# Patient Record
Sex: Male | Born: 1975
Health system: Southern US, Community
[De-identification: ages and names within clinical notes are randomized; demographics above are authoritative.]

## PROBLEM LIST (undated history)

## (undated) HISTORY — PX: WISDOM TOOTH EXTRACTION: SHX21

## (undated) HISTORY — PX: OTHER SURGICAL HISTORY: SHX169

---

## 2000-02-18 ENCOUNTER — Encounter: Payer: Self-pay | Admitting: General Surgery

## 2000-02-18 ENCOUNTER — Encounter: Payer: Self-pay | Admitting: Emergency Medicine

## 2000-02-18 ENCOUNTER — Inpatient Hospital Stay (HOSPITAL_COMMUNITY): Admission: RE | Admit: 2000-02-18 | Discharge: 2000-03-07 | Payer: Self-pay

## 2000-02-19 ENCOUNTER — Encounter: Payer: Self-pay | Admitting: General Surgery

## 2000-02-19 ENCOUNTER — Encounter: Payer: Self-pay | Admitting: *Deleted

## 2000-02-19 ENCOUNTER — Encounter: Payer: Self-pay | Admitting: Pulmonary Disease

## 2000-02-20 ENCOUNTER — Encounter: Payer: Self-pay | Admitting: General Surgery

## 2000-02-21 ENCOUNTER — Encounter: Payer: Self-pay | Admitting: General Surgery

## 2000-02-22 ENCOUNTER — Encounter: Payer: Self-pay | Admitting: General Surgery

## 2000-02-24 ENCOUNTER — Encounter: Payer: Self-pay | Admitting: General Surgery

## 2000-02-25 ENCOUNTER — Encounter: Payer: Self-pay | Admitting: Surgery

## 2000-02-26 ENCOUNTER — Encounter: Payer: Self-pay | Admitting: General Surgery

## 2000-02-27 ENCOUNTER — Encounter: Payer: Self-pay | Admitting: Pulmonary Disease

## 2000-02-27 ENCOUNTER — Encounter: Payer: Self-pay | Admitting: General Surgery

## 2000-02-28 ENCOUNTER — Encounter: Payer: Self-pay | Admitting: General Surgery

## 2000-02-29 ENCOUNTER — Encounter: Payer: Self-pay | Admitting: General Surgery

## 2000-03-02 ENCOUNTER — Encounter: Payer: Self-pay | Admitting: General Surgery

## 2000-03-03 ENCOUNTER — Encounter: Payer: Self-pay | Admitting: General Surgery

## 2000-03-04 ENCOUNTER — Encounter: Payer: Self-pay | Admitting: *Deleted

## 2000-03-05 ENCOUNTER — Encounter: Payer: Self-pay | Admitting: General Surgery

## 2000-03-07 ENCOUNTER — Inpatient Hospital Stay (HOSPITAL_COMMUNITY)
Admission: RE | Admit: 2000-03-07 | Discharge: 2000-03-20 | Payer: Self-pay | Admitting: Physical Medicine and Rehabilitation

## 2000-03-07 ENCOUNTER — Encounter: Payer: Self-pay | Admitting: General Surgery

## 2000-03-24 ENCOUNTER — Encounter
Admission: RE | Admit: 2000-03-24 | Discharge: 2000-04-11 | Payer: Self-pay | Admitting: Physical Medicine and Rehabilitation

## 2000-04-04 ENCOUNTER — Ambulatory Visit (HOSPITAL_COMMUNITY): Admission: RE | Admit: 2000-04-04 | Discharge: 2000-04-04 | Payer: Self-pay | Admitting: *Deleted

## 2000-04-04 ENCOUNTER — Encounter: Payer: Self-pay | Admitting: General Surgery

## 2000-04-09 ENCOUNTER — Encounter: Payer: Self-pay | Admitting: General Surgery

## 2000-04-09 ENCOUNTER — Ambulatory Visit (HOSPITAL_COMMUNITY): Admission: RE | Admit: 2000-04-09 | Discharge: 2000-04-09 | Payer: Self-pay | Admitting: General Surgery

## 2000-04-29 ENCOUNTER — Ambulatory Visit (HOSPITAL_COMMUNITY): Admission: RE | Admit: 2000-04-29 | Discharge: 2000-04-29 | Payer: Self-pay | Admitting: *Deleted

## 2000-04-29 ENCOUNTER — Encounter: Payer: Self-pay | Admitting: *Deleted

## 2000-05-06 ENCOUNTER — Encounter
Admission: RE | Admit: 2000-05-06 | Discharge: 2000-08-04 | Payer: Self-pay | Admitting: Physical Medicine and Rehabilitation

## 2000-07-22 ENCOUNTER — Encounter: Payer: Self-pay | Admitting: General Surgery

## 2000-07-22 ENCOUNTER — Ambulatory Visit (HOSPITAL_COMMUNITY): Admission: RE | Admit: 2000-07-22 | Discharge: 2000-07-22 | Payer: Self-pay | Admitting: General Surgery

## 2000-09-04 ENCOUNTER — Emergency Department (HOSPITAL_COMMUNITY): Admission: EM | Admit: 2000-09-04 | Discharge: 2000-09-04 | Payer: Self-pay | Admitting: Emergency Medicine

## 2003-02-08 ENCOUNTER — Emergency Department (HOSPITAL_COMMUNITY): Admission: EM | Admit: 2003-02-08 | Discharge: 2003-02-09 | Payer: Self-pay | Admitting: Emergency Medicine

## 2003-02-09 ENCOUNTER — Encounter: Payer: Self-pay | Admitting: Emergency Medicine

## 2003-04-03 ENCOUNTER — Emergency Department (HOSPITAL_COMMUNITY): Admission: EM | Admit: 2003-04-03 | Discharge: 2003-04-03 | Payer: Self-pay | Admitting: Emergency Medicine

## 2003-05-25 ENCOUNTER — Emergency Department (HOSPITAL_COMMUNITY): Admission: EM | Admit: 2003-05-25 | Discharge: 2003-05-25 | Payer: Self-pay | Admitting: Emergency Medicine

## 2006-06-15 ENCOUNTER — Emergency Department (HOSPITAL_COMMUNITY): Admission: EM | Admit: 2006-06-15 | Discharge: 2006-06-15 | Payer: Self-pay | Admitting: Emergency Medicine

## 2006-10-13 ENCOUNTER — Emergency Department (HOSPITAL_COMMUNITY): Admission: EM | Admit: 2006-10-13 | Discharge: 2006-10-13 | Payer: Self-pay | Admitting: Emergency Medicine

## 2007-08-12 ENCOUNTER — Emergency Department (HOSPITAL_COMMUNITY): Admission: EM | Admit: 2007-08-12 | Discharge: 2007-08-12 | Payer: Self-pay | Admitting: Emergency Medicine

## 2007-08-25 ENCOUNTER — Emergency Department (HOSPITAL_COMMUNITY): Admission: EM | Admit: 2007-08-25 | Discharge: 2007-08-26 | Payer: Self-pay | Admitting: Emergency Medicine

## 2007-12-01 ENCOUNTER — Emergency Department (HOSPITAL_COMMUNITY): Admission: EM | Admit: 2007-12-01 | Discharge: 2007-12-01 | Payer: Self-pay | Admitting: Emergency Medicine

## 2008-07-09 ENCOUNTER — Emergency Department (HOSPITAL_COMMUNITY): Admission: EM | Admit: 2008-07-09 | Discharge: 2008-07-09 | Payer: Self-pay | Admitting: Emergency Medicine

## 2009-02-20 ENCOUNTER — Emergency Department (HOSPITAL_COMMUNITY): Admission: EM | Admit: 2009-02-20 | Discharge: 2009-02-20 | Payer: Self-pay | Admitting: Emergency Medicine

## 2010-03-05 ENCOUNTER — Emergency Department (HOSPITAL_COMMUNITY): Admission: EM | Admit: 2010-03-05 | Discharge: 2010-03-06 | Payer: Self-pay | Admitting: Emergency Medicine

## 2010-10-12 NOTE — Op Note (Signed)
. [facility]  Patient:    Donald Ray, Donald Ray                      MRN: [id number] Proc. Date: 02/29/00 Adm. Date:  [id number] Attending:  Trauma, Md                           Operative Report  PREOPERATIVE DIAGNOSIS:  Severe closed head injury with respiratory failure.  POSTOPERATIVE DIAGNOSIS:  Severe closed head injury with respiratory failure.  OPERATION: 1. Percutaneous tracheostomy tube. 2. Percutaneous endoscopic gastrostomy tube.  SURGEON:  Jimmye Norman, M.D.  ASSISTANT:  Shawn Rayburn, P.A.-C.  ANESTHESIA:  General endotracheal anesthesia.  ESTIMATED BLOOD LOSS:  Less than 20 cc.  COMPLICATIONS:  None.  CONDITION:  Stable.  INDICATIONS:  The patient is a 35 year old male with a severe closed head injury and secondary respiratory failure who now required tracheostomy for long-term ventilatory support and pulmonary toilet and gastrostomy tube for continued nutritional support.  FINDINGS:  The patient had normal anatomy.  He had severe, thick, foul-smelling secretions from his trachea.  DESCRIPTION OF PROCEDURE:  The percutaneous endoscopic gastrostomy tube was done initially, and the left posterior gastroscope was passed through the patients oropharynx into the proximal esophagus, down into the stomach.  The anterior abdominal wall was prepped with Betadine prior to a percutaneous access.  We scoped down through the stomach into and through the pylorus into the proximal first portion of the duodenum.  We had no evidence of active disease.  Subsequently, we came to through the mid portion of the stomach just proximal to the incisor, saw the abdominal palpation where the capsule was passed into the gastric wall.  Once this 16-gauge catheter was passed, we passed a wire through the catheter into the stomach which was grabbed with a snare pass through endoscope.  We brought that out through the mouth and subsequently looped it around the  PEG tube which was brought back through the mouth, through the anterior abdominal wall which was enlarged with a #11 blade.  We brought it out to the bolster, re-endoscoped the patient and found the tube to be bolstered in the proper position.  When this was done, the bolster that came with the package was placed on the anterior abdominal wall, and the endoscope removed.  We placed the appropriate attachments on the gastrostomy tube.  Once the G tube was in place, we again prepped with Betadine at the neck and placed the patient in reverse Trendelenburg position.  A transverse incision was made just above the sternal notch using a #15 blade, and we dissected down to the pretracheal fascia, through the pretracheal fascia, underneath the thyroid gland to the second-third tracheal ring.  With Army-Navy retractor in place, we accessed the trachea using a 16-gauge needle and passing to the trachea with saline in an attached syringe to note entrance into the trachea. We aspirated out some large thickened mucoid drainage.  The capsule was passed into the trachea, and subsequently a wire passed through the catheter into the trachea, and it was noted to be in proper position using the bronchoscope. When this was done, the trachea was dilated to the proper size.  A #8 Shiley was passed over dilator and introducer and into the trachea and secured in place with Velcro tracheostomy tube holders.  We passed the cannula, attached it to the ventilator, showed there to be adequate carmine dye  on return.  The patient oxygenated well, but we did suction out a large amount of foul-smelling sputum. Once the tracheostomy was in place, a dressing was applied.  The patient was taken back to neurology ICU in stable condition. DD:  02/29/00 TD:  03/03/00 Job: [id number]
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
## 2010-10-12 NOTE — Op Note (Signed)
. [facility]  Patient:    Donald Ray, Donald Ray                      MRN: [id number] Proc. Date: 02/19/00 Adm. Date:  [id number] Disc. Date: [id number] Attending:  Evern Core                           Operative Report  OPERATION:  Insertion of left frontal ICP bolt, intracerebral for ICP measurement.  SURGEON:  Ricky D. Gasper Sells, M.D.  ASSISTANT:  Nil.  DESCRIPTION OF PROCEDURE:  With the left frontal region shaved and prepped in the usual fashion and staying approximately 1 cm in front of the coronal suture and 3 cm lateral to the mid sagittal plane, a small skin incision was made with a 15 blade knife and a drill was used to drill a hole through the skull and perforate the dura.  A Camino bolt was then screwed into the hole. It was connected to the ICP pressure monitoring device.  The opening pressure was 27 to 30 mmHg and the CCP at time of procedure was 57.1.  A Betadine ointment 4 x 4 was then wrapped around the monitoring device and it was taped in place with pink plastic tape.  No complications occurred. DD:  04/09/00 TD:  04/09/00 Job: [id number]
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
## 2011-02-18 LAB — POCT I-STAT, CHEM 8
BUN: 14
Calcium, Ion: 1.14
Chloride: 104
Creatinine, Ser: 1.3
Glucose, Bld: 88
HCT: 45
Hemoglobin: 15.3
Potassium: 3.4 — ABNORMAL LOW
Sodium: 140
TCO2: 27

## 2011-02-18 LAB — POCT CARDIAC MARKERS
CKMB, poc: 1.9
Myoglobin, poc: 62.2
Operator id: 270651
Troponin i, poc: <0.05

## 2011-04-29 ENCOUNTER — Emergency Department (HOSPITAL_COMMUNITY)
Admission: EM | Admit: 2011-04-29 | Discharge: 2011-04-29 | Disposition: A | Discharge status: Home / Self Care | Payer: Medicare Other | Attending: Emergency Medicine | Admitting: Emergency Medicine

## 2011-04-29 ENCOUNTER — Emergency Department (HOSPITAL_COMMUNITY): Payer: Medicare Other

## 2011-04-29 DIAGNOSIS — R0600 Dyspnea, unspecified: Secondary | ICD-10-CM

## 2011-04-29 DIAGNOSIS — R0609 Other forms of dyspnea: Secondary | ICD-10-CM | POA: Insufficient documentation

## 2011-04-29 DIAGNOSIS — R0989 Other specified symptoms and signs involving the circulatory and respiratory systems: Secondary | ICD-10-CM | POA: Insufficient documentation

## 2011-04-29 DIAGNOSIS — R0602 Shortness of breath: Secondary | ICD-10-CM | POA: Insufficient documentation

## 2011-04-29 MED ORDER — ALBUTEROL SULFATE (5 MG/ML) 0.5% IN NEBU
5.0000 mg | INHALATION_SOLUTION | RESPIRATORY_TRACT | Status: AC
  Administered 2011-04-29: 2.5 mg via RESPIRATORY_TRACT
  Filled 2011-04-29: qty 0.5

## 2011-04-29 MED ORDER — ALBUTEROL SULFATE HFA 108 (90 BASE) MCG/ACT IN AERS: 2.0000 | INHALATION_SPRAY | RESPIRATORY_TRACT | Status: AC | PRN

## 2011-04-29 MED ORDER — IPRATROPIUM BROMIDE 0.02 % IN SOLN
0.5000 mg | Freq: Once | RESPIRATORY_TRACT | Status: AC
  Administered 2011-04-29: 0.5 mg via RESPIRATORY_TRACT
  Filled 2011-04-29: qty 2.5

## 2011-04-29 NOTE — ED Notes (Signed)
Pt resting comfortably after neb. Denies hx of cough or cold. Lungs equal and clear. Denies feeling anxious at this time. States last time he came to ed for this complaint he was rx antianxiety med that did help,

## 2011-04-29 NOTE — ED Notes (Signed)
Pt. Has sob for  Intermittent for years, Is not sure what triggers it. This episode began on SAturday.  ?Asthma, pt. Denies any pain

## 2011-04-29 NOTE — ED Provider Notes (Signed)
History     CSN: [id number] Arrival date & time: 04/29/2011 10:06 AM   First MD Initiated Contact with Patient 04/29/11 1024      Chief Complaint  Patient presents with  . Shortness of Breath    sob for years,  This episode came on Late Saturday night,  intermittent sob.  movement decreases the sob    (Consider location/radiation/quality/duration/timing/severity/associated sxs/prior treatment) HPI Comments: Patient reports that twice a year he has episodes of shortness of breath that last several days, are improved by albuterol.  This episode began two days ago.  States he feels he has to keep taking deep breaths and that when he is breathing at a normal rate he feels short winded.  States the SOB is worse with sitting still, better with walking around and forgetting about it.  This happens every year, at various times of year.  Pt does have a history of seasonal allergies but states this is mostly in the spring.  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  States he is feeling a little bit anxious and his chest feels tight.  Denies fevers, recent illness, cough, lightheadedness, N/V, abdominal pain.  Does not have a PCP.    Patient is a 35 y.o. male presenting with shortness of breath. The history is provided by the patient.  Shortness of Breath  The current episode started 2 days ago. The problem has been unchanged. The problem is mild. The symptoms are relieved by beta-agonist inhalers (walking around, being active, being distracted). Exacerbated by: sitting still. Associated symptoms include shortness of breath. Pertinent negatives include no chest pressure, no orthopnea, no fever, no rhinorrhea, no sore throat, no stridor, no cough and no wheezing. He was not exposed to toxic fumes. His past medical history does not include asthma, bronchiolitis or past wheezing.    History reviewed. No pertinent past medical history.  History reviewed.  No pertinent past surgical history.  Family History  Problem Relation Age of Onset  . Diabetes Mother   . Asthma Father     History  Substance Use Topics  . Smoking status: Current Some Day Smoker  . Smokeless tobacco: Not on file  . Alcohol Use: Yes      Review of Systems  Constitutional: Negative for fever.  HENT: Negative for sore throat and rhinorrhea.   Respiratory: Positive for shortness of breath. Negative for cough, wheezing and stridor.   Cardiovascular: Negative for orthopnea.  All other systems reviewed and are negative.    Allergies  Sulfa antibiotics  Home Medications  No current outpatient prescriptions on file.  BP 118/83  Pulse 64  Temp(Src) 97.6 F (36.4 C) (Oral)  Resp 14  SpO2 98%  Physical Exam  Nursing note and vitals reviewed. Constitutional: He is oriented to person, place, and time. He appears well-developed and well-nourished.  HENT:  Head: Normocephalic and atraumatic.  Neck: Neck supple.  Cardiovascular: Normal rate, regular rhythm and normal heart sounds.   Pulmonary/Chest: Effort normal and breath sounds normal. No respiratory distress. He has no wheezes. He has no rales. He exhibits no tenderness.  Abdominal: Soft. Bowel sounds are normal. He exhibits no distension. There is no tenderness.  Neurological: He is alert and oriented to person, place, and time.    ED Course  Procedures (including critical care time)  12:32 PM Patient reports improvement following neb treatment.  Lungs CTAB, O2 100%  Labs Reviewed - No data to  display Dg Chest 2 View  04/29/2011  *RADIOLOGY REPORT*  Clinical Data: Shortness of breath  CHEST - 2 VIEW  Comparison: 07/09/2008  Findings: Cardiomediastinal silhouette is stable.  Old rib fractures are stable.  No acute infiltrate or pleural effusion.  No pulmonary edema.  Thoracic spine is stable.  IMPRESSION: No active disease.  No significant change.  Original Report Authenticated By: Natasha Mead, M.D.     Date: 04/29/2011  Rate: 63  Rhythm: normal sinus rhythm  QRS Axis: normal  Intervals: normal  ST/T Wave abnormalities: early repolarization  Conduction Disutrbances:none  Narrative Interpretation:   Old EKG Reviewed: unchanged   1. Dyspnea       MDM  Patient with episode of shortness of breath, improved with albuterol.  States this happens twice a year but he has never been diagnosed with asthma.  SOB is improved with activity, doubt cardiac cause.  Likely allergic/asthmatic or anxiety.  Pt given PCP follow up.         Dillard Cannon [place], [place] 04/29/11 [phone]

## 2011-04-29 NOTE — ED Notes (Signed)
resp therapy here to do neb

## 2011-04-29 NOTE — ED Provider Notes (Signed)
Medical screening examination/treatment/procedure(s) were performed by non-physician practitioner and as supervising physician I was immediately available for consultation/collaboration. Devoria Albe, MD, Armando Gang   Ward Givens, MD 04/29/11 1910

## 2012-07-03 ENCOUNTER — Encounter (HOSPITAL_COMMUNITY): Payer: Self-pay | Admitting: *Deleted

## 2012-07-03 ENCOUNTER — Emergency Department (HOSPITAL_COMMUNITY): Payer: Medicare Other

## 2012-07-03 ENCOUNTER — Emergency Department (HOSPITAL_COMMUNITY)
Admission: EM | Admit: 2012-07-03 | Discharge: 2012-07-03 | Disposition: A | Discharge status: Home / Self Care | Payer: Medicare Other | Attending: Emergency Medicine | Admitting: Emergency Medicine

## 2012-07-03 DIAGNOSIS — Z79899 Other long term (current) drug therapy: Secondary | ICD-10-CM | POA: Insufficient documentation

## 2012-07-03 DIAGNOSIS — R0789 Other chest pain: Secondary | ICD-10-CM

## 2012-07-03 DIAGNOSIS — R5383 Other fatigue: Secondary | ICD-10-CM | POA: Insufficient documentation

## 2012-07-03 DIAGNOSIS — R079 Chest pain, unspecified: Secondary | ICD-10-CM | POA: Diagnosis not present

## 2012-07-03 DIAGNOSIS — R5381 Other malaise: Secondary | ICD-10-CM | POA: Diagnosis not present

## 2012-07-03 DIAGNOSIS — R0602 Shortness of breath: Secondary | ICD-10-CM | POA: Insufficient documentation

## 2012-07-03 DIAGNOSIS — F17200 Nicotine dependence, unspecified, uncomplicated: Secondary | ICD-10-CM | POA: Diagnosis not present

## 2012-07-03 LAB — CBC
MCH: 31.1 pg (ref 26.0–34.0)
MCHC: 35.2 g/dL (ref 30.0–36.0)
MCV: 88.3 fL (ref 78.0–100.0)
Platelets: 235 10*3/uL (ref 150–400)
RBC: 5.21 MIL/uL (ref 4.22–5.81)
RDW: 12.4 % (ref 11.5–15.5)

## 2012-07-03 LAB — BASIC METABOLIC PANEL
BUN: 18 mg/dL (ref 6–23)
CO2: 25 mEq/L (ref 19–32)
Chloride: 103 mEq/L (ref 96–112)
Glucose, Bld: 85 mg/dL (ref 70–99)
Potassium: 4.3 mEq/L (ref 3.5–5.1)

## 2012-07-03 NOTE — ED Notes (Signed)
Pt reports a mild achy chest pain starting this am. Felt shob, like he can't get a full breath x2 days. Denies cough. Felt same way Wed, better yesterday, then bad again today. In NAD. Has had this pain and shob before, sts he was told it was related to anxiety but does not believe it is as he is not stressed when it happens.

## 2012-07-03 NOTE — ED Provider Notes (Signed)
History     CSN: [id number]  Arrival date & time 07/03/12  1159   First MD Initiated Contact with Patient 07/03/12 1225      Chief Complaint  Patient presents with  . Shortness of Breath  . Chest Pain    (Consider location/radiation/quality/duration/timing/severity/associated sxs/prior treatment) HPI  Allin Frix is a 37 y.o. male c/o left sided chest pain, described as "achey", rated at 7/10 at worst, 5/10 now.  Pain started x3 days ago.  Pain has been intermittent andpleuritic.  Non-exertional, non-pleuritic or positional. Pain is associated with SOB fatigue x3 days. Denies SOB, N/V, diaphoresis, cough, fever, back pain, syncope, prior episodes. Denies recent travel, leg swelling, hemoptysis.  Pt has not received any ASA or NTG in the last 24 hours.  RF: None PCP: None   History reviewed. No pertinent past medical history.  History reviewed. No pertinent past surgical history.  Family History  Problem Relation Age of Onset  . Diabetes Mother   . Asthma Father     History  Substance Use Topics  . Smoking status: Current Some Day Smoker  . Smokeless tobacco: Not on file  . Alcohol Use: Yes      Review of Systems  Constitutional: Positive for fatigue. Negative for fever.  Respiratory: Positive for shortness of breath.   Cardiovascular: Positive for chest pain.  Gastrointestinal: Negative for nausea, vomiting, abdominal pain and diarrhea.  All other systems reviewed and are negative.    Allergies  Sulfa antibiotics  Home Medications   Current Outpatient Rx  Name  Route  Sig  Dispense  Refill  . GLUCOSAMINE-CHONDROITIN 500-400 MG PO TABS   Oral   Take 1 tablet by mouth daily as needed. Knee discomfort.         Carma Leaven M PLUS PO TABS   Oral   Take 1 tablet by mouth daily.           Marland Kitchen FISH OIL PO   Oral   Take 2 capsules by mouth daily.           Marland Kitchen OVER THE COUNTER MEDICATION   Oral   Take 1 capsule by mouth daily. Fat-burning/appetite  supressing supplement.         . ALBUTEROL SULFATE HFA 108 (90 BASE) MCG/ACT IN AERS   Inhalation   Inhale 2 puffs into the lungs every 4 (four) hours as needed for wheezing.   1 Inhaler   0     BP 120/69  Pulse 71  Temp 97.6 F (36.4 C) (Oral)  Resp 18  SpO2 98%  Physical Exam  Nursing note and vitals reviewed. Constitutional: He is oriented to person, place, and time. He appears well-developed and well-nourished. No distress.  HENT:  Head: Normocephalic and atraumatic.  Mouth/Throat: Oropharynx is clear and moist.  Eyes: Conjunctivae normal and EOM are normal. Pupils are equal, round, and reactive to light.  Neck: Normal range of motion. No JVD present.  Cardiovascular: Normal rate, regular rhythm, normal heart sounds and intact distal pulses.  Exam reveals no gallop and no friction rub.   No murmur heard. Pulmonary/Chest: Effort normal and breath sounds normal. No stridor. No respiratory distress. He has no wheezes. He has no rales. He exhibits no tenderness.  Abdominal: Soft. Bowel sounds are normal. He exhibits no distension and no mass. There is no tenderness. There is no rebound and no guarding.  Musculoskeletal: Normal range of motion.  Lymphadenopathy:    He has no cervical adenopathy.  Neurological:  He is alert and oriented to person, place, and time.  Psychiatric: He has a normal mood and affect.    ED Course  Procedures (including critical care time)  Labs Reviewed  CBC  BASIC METABOLIC PANEL  POCT I-STAT TROPONIN I   Dg Chest 2 View  07/03/2012  *RADIOLOGY REPORT*  Clinical Data: 37 year old male chest pain shortness of breath.  CHEST - 2 VIEW  Comparison: 04/29/2011 and earlier.  Findings: Chronic bilateral rib fractures.  Stable and normal lung volumes. Normal cardiac size and mediastinal contours.  Visualized tracheal air column is within normal limits.  No pneumothorax, pulmonary edema, pleural effusion or confluent pulmonary opacity. No acute osseous  abnormality identified.  IMPRESSION: No acute cardiopulmonary abnormality.   Original Report Authenticated By: Erskine Speed, M.D.     Date: 07/04/2012  Rate: 76  Rhythm: normal sinus rhythm  QRS Axis: normal  Intervals: normal  ST/T Wave abnormalities: normal  Conduction Disutrbances:none  Narrative Interpretation: Mild early re pole  Old EKG Reviewed: None available   1. SOB (shortness of breath)   2. Atypical chest pain       MDM  Patient with shortness of breath and chest pain. Pt refuses Pain meds, says he's not in pain, just achy  Patient is to be discharged with recommendation to follow up with PCP in regards to today's hospital visit. Chest pain is not likely of cardiac or pulmonary etiology d/t presentation, perc negative, VSS, no tracheal deviation, no JVD or new murmur, RRR, breath sounds equal bilaterally, EKG without acute abnormalities, negative troponin, and negative CXR. Pt has been advised start a PPI and return to the ED is CP becomes exertional, associated with diaphoresis or nausea, radiates to left jaw/arm, worsens or becomes concerning in any way. Pt appears reliable for follow up and is agreeable to discharge.     Pt verbalized understanding and agrees with care plan. Outpatient follow-up and return precautions given.     Wynetta Emery, PA-C 07/04/12 1406

## 2012-07-04 NOTE — ED Provider Notes (Signed)
Medical screening examination/treatment/procedure(s) were performed by non-physician practitioner and as supervising physician I was immediately available for consultation/collaboration.  Derwood Kaplan, MD 07/04/12 1759

## 2012-10-09 DIAGNOSIS — Z113 Encounter for screening for infections with a predominantly sexual mode of transmission: Secondary | ICD-10-CM | POA: Diagnosis not present

## 2012-10-09 DIAGNOSIS — M25569 Pain in unspecified knee: Secondary | ICD-10-CM | POA: Diagnosis not present

## 2012-10-09 DIAGNOSIS — R5383 Other fatigue: Secondary | ICD-10-CM | POA: Diagnosis not present

## 2012-10-09 DIAGNOSIS — Z0000 Encounter for general adult medical examination without abnormal findings: Secondary | ICD-10-CM | POA: Diagnosis not present

## 2012-10-09 DIAGNOSIS — Z136 Encounter for screening for cardiovascular disorders: Secondary | ICD-10-CM | POA: Diagnosis not present

## 2012-10-09 DIAGNOSIS — R5381 Other malaise: Secondary | ICD-10-CM | POA: Diagnosis not present

## 2013-04-02 ENCOUNTER — Emergency Department (INDEPENDENT_AMBULATORY_CARE_PROVIDER_SITE_OTHER): Payer: Medicare Other

## 2013-04-02 ENCOUNTER — Emergency Department (INDEPENDENT_AMBULATORY_CARE_PROVIDER_SITE_OTHER)
Admission: EM | Admit: 2013-04-02 | Discharge: 2013-04-02 | Disposition: A | Discharge status: Home / Self Care | Payer: Medicare Other | Source: Home / Self Care | Attending: Emergency Medicine | Admitting: Emergency Medicine

## 2013-04-02 ENCOUNTER — Encounter (HOSPITAL_COMMUNITY): Payer: Self-pay | Admitting: Emergency Medicine

## 2013-04-02 DIAGNOSIS — S239XXA Sprain of unspecified parts of thorax, initial encounter: Secondary | ICD-10-CM

## 2013-04-02 DIAGNOSIS — R079 Chest pain, unspecified: Secondary | ICD-10-CM | POA: Diagnosis not present

## 2013-04-02 DIAGNOSIS — S29012A Strain of muscle and tendon of back wall of thorax, initial encounter: Secondary | ICD-10-CM

## 2013-04-02 LAB — D-DIMER, QUANTITATIVE: D-Dimer, Quant: <0.27 ug/mL-FEU (ref 0.00–0.48)

## 2013-04-02 MED ORDER — ACETAMINOPHEN 325 MG PO TABS
650.0000 mg | ORAL_TABLET | Freq: Once | ORAL | Status: AC
  Administered 2013-04-02: 650 mg via ORAL

## 2013-04-02 MED ORDER — MELOXICAM 15 MG PO TABS: 15.0000 mg | ORAL_TABLET | Freq: Every day | ORAL | Status: DC

## 2013-04-02 MED ORDER — ACETAMINOPHEN 325 MG PO TABS
ORAL_TABLET | ORAL | Status: AC
  Filled 2013-04-02: qty 2

## 2013-04-02 MED ORDER — METHOCARBAMOL 500 MG PO TABS: 500.0000 mg | ORAL_TABLET | Freq: Three times a day (TID) | ORAL | Status: DC

## 2013-04-02 MED ORDER — TRAMADOL HCL 50 MG PO TABS: 100.0000 mg | ORAL_TABLET | Freq: Three times a day (TID) | ORAL | Status: DC | PRN

## 2013-04-02 NOTE — ED Provider Notes (Signed)
Chief Complaint:   Chief Complaint  Patient presents with  . Back Pain    History of Present Illness:   Raymie Giammarco is a 37 year old male who woke up this morning with pain in his right upper back, just adjacent to the shoulder blade. He denies any injury. He describes the pain as a burning rated 5/10 in intensity. It's worse with movement of the neck or the shoulder and also the deep breath and better if she lies flat. He felt mildly short of breath. He denies fever, chills, sweats, coughing, wheezing, or sputum production. He's had no palpitations, dizziness, syncope, abdominal pain, leg pain or swelling, or history of pulmonary embolus. He denies any radiation down the arm, numbness, tingling, or weakness in the arm, and there's been no swelling of the arm.  Review of Systems:  Other than noted above, the patient denies any of the following symptoms: Constitutional:  No fever, chills, or sweats. ENT:  No nasal congestion, sore throat, or oral ulcerations or lesions. Neck:  No swelling, or adenopathy.  Full ROM without pain. Cardiac:  No chest pain, tightness, or pressure. Respiratory:  No cough, wheezing, or dyspnea. M-S:  No joint pain, muscle pain, or back problems. Neuro:  No muscle weakness, numbness or paresthesias.  PMFSH:  Past medical history, family history, social history, meds, and allergies were reviewed.  He's allergic to sulfa. He smokes occasional cigars.  Physical Exam:   Vital signs:  BP 126/81  Pulse 65  Temp(Src) 98.3 F (36.8 C) (Oral)  Resp 18  SpO2 100% General:  Alert, oriented and in no distress. Eye:  PERRL, full EOMs. ENT:  Pharynx clear, no oral lesions. Neck:  No cervical tenderness to palpation, neck has a full range of motion with no pain. Back: There was tenderness to palpation in the right upper back, just adjacent to the shoulder blade. This was in a fairly well-circumscribed area there was no rash in this area. Lungs:  No respiratory distress.   Breath sounds clear and equal bilaterally.  No wheezes, rales or rhonchi. Lungs were resonant to percussion. Heart:  Regular rhythm.  No gallops, murmers, or rubs. Ext:  No upper extremity edema, pulses full.  Full ROM of joints with no joint or muscle pain to palpation. No calf tenderness, Homans sign was negative. The shoulder have full range of motion with no pain. Impingement signs are negative. Neuro:  Alert and oriented times 3.  No focal muscle weakness.  DTRs symmetric.  Sensation intact to light touch. Skin: Clear, warm and dry.  No rash.  Good capillary refill.  Results for orders placed during the hospital encounter of 04/02/13  D-DIMER, QUANTITATIVE      Result Value Range   D-Dimer, Quant <0.27  0.00 - 0.48 ug/mL-FEU     Radiology:  Dg Chest 2 View  04/02/2013   CLINICAL DATA:  Right upper back pain. Smoker.  EXAM: CHEST  2 VIEW  COMPARISON:  04/29/2011. 07/03/2012.  FINDINGS: The lungs are clear without focal infiltrate, edema, pneumothorax or pleural effusion. The cardiopericardial silhouette is within normal limits for size. Imaged bony structures of the thorax are intact.  IMPRESSION: Stable. No acute findings.   Electronically Signed   By: Kennith Center M.D.   On: 04/02/2013 13:58   Assessment:  The encounter diagnosis was Upper back strain, initial encounter.  This is most likely musculoskeletal. I told him there was a possibility of shingles in to be on the lookout for a rash,  if that were to happen he should come back here immediately.   Plan:   1.  Meds:  The following meds were prescribed:   Discharge Medication List as of 04/02/2013  2:52 PM    START taking these medications   Details  meloxicam (MOBIC) 15 MG tablet Take 1 tablet (15 mg total) by mouth daily., Starting 04/02/2013, Until Discontinued, Normal    methocarbamol (ROBAXIN) 500 MG tablet Take 1 tablet (500 mg total) by mouth 3 (three) times daily., Starting 04/02/2013, Until Discontinued, Normal     traMADol (ULTRAM) 50 MG tablet Take 2 tablets (100 mg total) by mouth every 8 (eight) hours as needed., Starting 04/02/2013, Until Discontinued, Normal        2.  Patient Education/Counseling:  The patient was given appropriate handouts, self care instructions, and instructed in symptomatic relief.    3.  Follow up:  The patient was told to follow up if no better in 3 to 4 days, if becoming worse in any way, and given some red flag symptoms such as skin rash  which would prompt immediate return.  Follow up  Here if needed.     Reuben Likes, MD 04/02/13 1630

## 2013-04-02 NOTE — ED Notes (Signed)
Returned to treatment room from xray 

## 2013-04-02 NOTE — ED Notes (Signed)
Upper right back pain, onset this am.  Reports feeling sob.  Patient denies injury.  Patient does work out regularly in the gym-cardio only yesterday

## 2013-05-26 ENCOUNTER — Emergency Department (INDEPENDENT_AMBULATORY_CARE_PROVIDER_SITE_OTHER)
Admission: EM | Admit: 2013-05-26 | Discharge: 2013-05-26 | Disposition: A | Discharge status: Home / Self Care | Payer: Medicare Other | Source: Home / Self Care

## 2013-05-26 ENCOUNTER — Encounter (HOSPITAL_COMMUNITY): Payer: Self-pay | Admitting: Emergency Medicine

## 2013-05-26 DIAGNOSIS — R51 Headache: Secondary | ICD-10-CM | POA: Diagnosis not present

## 2013-05-26 DIAGNOSIS — B9789 Other viral agents as the cause of diseases classified elsewhere: Secondary | ICD-10-CM

## 2013-05-26 DIAGNOSIS — R110 Nausea: Secondary | ICD-10-CM

## 2013-05-26 DIAGNOSIS — B349 Viral infection, unspecified: Secondary | ICD-10-CM

## 2013-05-26 DIAGNOSIS — R519 Headache, unspecified: Secondary | ICD-10-CM

## 2013-05-26 MED ORDER — HYDROCOD POLST-CHLORPHEN POLST 10-8 MG/5ML PO LQCR: 5.0000 mL | Freq: Two times a day (BID) | ORAL | Status: DC | PRN

## 2013-05-26 MED ORDER — ONDANSETRON HCL 4 MG PO TABS: 4.0000 mg | ORAL_TABLET | Freq: Four times a day (QID) | ORAL | Status: DC
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
## 2013-05-26 NOTE — ED Notes (Signed)
37 yr old is here with complaints of N/V, body aches, chills, fever, fatigue/malaise  since Monday night. Pt states he ate a banana after working out at gym and started feeling bad. Denies: SOB, chest pain

## 2013-05-30 NOTE — ED Provider Notes (Signed)
Medical screening examination/treatment/procedure(s) were performed by a resident physician or non-physician practitioner and as the supervising physician I was immediately available for consultation/collaboration.  Aoife Bold, MD    Yuna Pizzolato S Domanik [place], MD 05/30/13 0845 

## 2014-09-28 ENCOUNTER — Emergency Department (INDEPENDENT_AMBULATORY_CARE_PROVIDER_SITE_OTHER): Payer: Medicare Other

## 2014-09-28 ENCOUNTER — Encounter (HOSPITAL_COMMUNITY): Payer: Self-pay

## 2014-09-28 ENCOUNTER — Emergency Department (INDEPENDENT_AMBULATORY_CARE_PROVIDER_SITE_OTHER)
Admission: EM | Admit: 2014-09-28 | Discharge: 2014-09-28 | Disposition: A | Discharge status: Home / Self Care | Payer: Medicare Other | Source: Home / Self Care | Attending: Family Medicine | Admitting: Family Medicine

## 2014-09-28 DIAGNOSIS — M25532 Pain in left wrist: Secondary | ICD-10-CM | POA: Diagnosis not present

## 2014-09-28 MED ORDER — NAPROXEN 375 MG PO TABS: 375.0000 mg | ORAL_TABLET | Freq: Two times a day (BID) | ORAL | Status: AC

## 2014-09-28 NOTE — Discharge Instructions (Signed)
Thank you for coming in today. Use the brace.  Follow up with Dr. Melvyn Novasrtmann.  Take the naproxen twice daily for pain.  Return as needed.   Wrist Sprain with Rehab A sprain is an injury in which a ligament that maintains the proper alignment of a joint is partially or completely torn. The ligaments of the wrist are susceptible to sprains. Sprains are classified into three categories. Grade 1 sprains cause pain, but the tendon is not lengthened. Grade 2 sprains include a lengthened ligament because the ligament is stretched or partially ruptured. With grade 2 sprains there is still function, although the function may be diminished. Grade 3 sprains are characterized by a complete tear of the tendon or muscle, and function is usually impaired. SYMPTOMS   Pain tenderness, inflammation, and/or bruising (contusion) of the injury.  A "pop" or tear felt and/or heard at the time of injury.  Decreased wrist function. CAUSES  A wrist sprain occurs when a force is placed on one or more ligaments that is greater than it/they can withstand. Common mechanisms of injury include:  Catching a ball with you hands.  Repetitive and/ or strenuous extension or flexion of the wrist. RISK INCREASES WITH:  Previous wrist injury.  Contact sports (boxing or wrestling).  Activities in which falling is common.  Poor strength and flexibility.  Improperly fitted or padded protective equipment. PREVENTION  Warm up and stretch properly before activity.  Allow for adequate recovery between workouts.  Maintain physical fitness:  Strength, flexibility, and endurance.  Cardiovascular fitness.  Protect the wrist joint by limiting its motion with the use of taping, braces, or splints.  Protect the wrist after injury for 6 to 12 months. PROGNOSIS  The prognosis for wrist sprains depends on the degree of injury. Grade 1 sprains require 2 to 6 weeks of treatment. Grade 2 sprains require 6 to 8 weeks of treatment,  and grade 3 sprains require up to 12 weeks.  RELATED COMPLICATIONS   Prolonged healing time, if improperly treated or re-injured.  Recurrent symptoms that result in a chronic problem.  Injury to nearby structures (bone, cartilage, nerves, or tendons).  Arthritis of the wrist.  Inability to compete in athletics at a high level.  Wrist stiffness or weakness.  Progression to a complete rupture of the ligament. TREATMENT  Treatment initially involves resting from any activities that aggravate the symptoms, and the use of ice and medications to help reduce pain and inflammation. Your caregiver may recommend immobilizing the wrist for a period of time in order to reduce stress on the ligament and allow for healing. After immobilization it is important to perform strengthening and stretching exercises to help regain strength and a full range of motion. These exercises may be completed at home or with a therapist. Surgery is not usually required for wrist sprains, unless the ligament has been ruptured (grade 3 sprain). MEDICATION   If pain medication is necessary, then nonsteroidal anti-inflammatory medications, such as aspirin and ibuprofen, or other minor pain relievers, such as acetaminophen, are often recommended.  Do not take pain medication for 7 days before surgery.  Prescription pain relievers may be given if deemed necessary by your caregiver. Use only as directed and only as much as you need. HEAT AND COLD  Cold treatment (icing) relieves pain and reduces inflammation. Cold treatment should be applied for 10 to 15 minutes every 2 to 3 hours for inflammation and pain and immediately after any activity that aggravates your symptoms. Use ice  packs or massage the area with a piece of ice (ice massage).  Heat treatment may be used prior to performing the stretching and strengthening activities prescribed by your caregiver, physical therapist, or athletic trainer. Use a heat pack or soak  your injury in warm water. SEEK MEDICAL CARE IF:  Treatment seems to offer no benefit, or the condition worsens.  Any medications produce adverse side effects.
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
## 2014-09-28 NOTE — ED Notes (Signed)
Wrist pain, no known injury

## 2015-05-10 DIAGNOSIS — Z7189 Other specified counseling: Secondary | ICD-10-CM | POA: Diagnosis not present

## 2015-05-10 DIAGNOSIS — R0602 Shortness of breath: Secondary | ICD-10-CM | POA: Diagnosis not present

## 2015-05-10 DIAGNOSIS — R51 Headache: Secondary | ICD-10-CM | POA: Diagnosis not present

## 2015-05-30 DIAGNOSIS — E559 Vitamin D deficiency, unspecified: Secondary | ICD-10-CM | POA: Diagnosis not present

## 2015-05-30 DIAGNOSIS — Z658 Other specified problems related to psychosocial circumstances: Secondary | ICD-10-CM | POA: Diagnosis not present

## 2015-05-30 DIAGNOSIS — R7303 Prediabetes: Secondary | ICD-10-CM | POA: Diagnosis not present

## 2015-05-30 DIAGNOSIS — Z0000 Encounter for general adult medical examination without abnormal findings: Secondary | ICD-10-CM | POA: Diagnosis not present

## 2015-06-26 DIAGNOSIS — H532 Diplopia: Secondary | ICD-10-CM | POA: Diagnosis not present

## 2015-06-26 DIAGNOSIS — H5030 Unspecified intermittent heterotropia: Secondary | ICD-10-CM | POA: Diagnosis not present

## 2015-06-26 DIAGNOSIS — H5213 Myopia, bilateral: Secondary | ICD-10-CM | POA: Diagnosis not present

## 2015-06-26 DIAGNOSIS — R51 Headache: Secondary | ICD-10-CM | POA: Diagnosis not present

## 2015-07-03 DIAGNOSIS — Z8669 Personal history of other diseases of the nervous system and sense organs: Secondary | ICD-10-CM | POA: Diagnosis not present

## 2015-09-08 ENCOUNTER — Encounter (HOSPITAL_COMMUNITY): Payer: Self-pay | Admitting: *Deleted

## 2015-09-08 ENCOUNTER — Emergency Department (HOSPITAL_COMMUNITY)
Admission: EM | Admit: 2015-09-08 | Discharge: 2015-09-08 | Disposition: A | Discharge status: Home / Self Care | Payer: Medicare Other | Attending: Emergency Medicine | Admitting: Emergency Medicine

## 2015-09-08 DIAGNOSIS — Z79899 Other long term (current) drug therapy: Secondary | ICD-10-CM | POA: Insufficient documentation

## 2015-09-08 DIAGNOSIS — H5789 Other specified disorders of eye and adnexa: Secondary | ICD-10-CM

## 2015-09-08 DIAGNOSIS — H578 Other specified disorders of eye and adnexa: Secondary | ICD-10-CM | POA: Insufficient documentation

## 2015-09-08 DIAGNOSIS — F17200 Nicotine dependence, unspecified, uncomplicated: Secondary | ICD-10-CM | POA: Insufficient documentation

## 2015-09-08 MED ORDER — FLUORESCEIN SODIUM 1 MG OP STRP
1.0000 | ORAL_STRIP | Freq: Once | OPHTHALMIC | Status: AC
  Administered 2015-09-08: 1 via OPHTHALMIC
  Filled 2015-09-08: qty 1

## 2015-09-08 MED ORDER — ERYTHROMYCIN 5 MG/GM OP OINT
1.0000 | TOPICAL_OINTMENT | Freq: Once | OPHTHALMIC | Status: AC
Start: 2015-09-08 — End: 2015-09-08
  Administered 2015-09-08: 1 via OPHTHALMIC
  Filled 2015-09-08: qty 3.5

## 2015-09-08 MED ORDER — TETRACAINE HCL 0.5 % OP SOLN
2.0000 [drp] | Freq: Once | OPHTHALMIC | Status: AC
  Administered 2015-09-08: 2 [drp] via OPHTHALMIC
  Filled 2015-09-08: qty 2

## 2015-09-08 NOTE — Discharge Instructions (Signed)
You have been seen today for eye irritation. There were no abnormalities found on your exam, however, the exam of the eye is limited in the ED. Recommend follow up with ophthalmology as soon as possible. Call the number provided to set up an appointment. In the meantime, use erythromycin ointment in the affected eye. Apply 4 times a day for the next 5 days. This is to help prevent infection. This medication has been provided to you prior to your discharge from the ED today. Follow up with PCP as needed. Return to ED should symptoms worsen.

## 2015-09-08 NOTE — ED Notes (Signed)
Pt is in stable condition upon d/c and ambulates from ED. 

## 2015-09-08 NOTE — ED Notes (Signed)
Pt states he started feeling something in his left eye yesterday afternoon. Pt thinks it could be an eyelash and has attempted to flush his eye out with no relief.
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
## 2015-09-09 ENCOUNTER — Emergency Department (HOSPITAL_COMMUNITY)
Admission: EM | Admit: 2015-09-09 | Discharge: 2015-09-09 | Disposition: A | Discharge status: Home / Self Care | Payer: Medicare Other | Attending: Emergency Medicine | Admitting: Emergency Medicine

## 2015-09-09 ENCOUNTER — Encounter (HOSPITAL_COMMUNITY): Payer: Self-pay

## 2015-09-09 DIAGNOSIS — Z79899 Other long term (current) drug therapy: Secondary | ICD-10-CM | POA: Insufficient documentation

## 2015-09-09 DIAGNOSIS — H578 Other specified disorders of eye and adnexa: Secondary | ICD-10-CM | POA: Diagnosis not present

## 2015-09-09 DIAGNOSIS — Z791 Long term (current) use of non-steroidal anti-inflammatories (NSAID): Secondary | ICD-10-CM | POA: Diagnosis not present

## 2015-09-09 DIAGNOSIS — F17200 Nicotine dependence, unspecified, uncomplicated: Secondary | ICD-10-CM | POA: Diagnosis not present

## 2015-09-09 DIAGNOSIS — H5712 Ocular pain, left eye: Secondary | ICD-10-CM | POA: Diagnosis present

## 2015-09-09 DIAGNOSIS — H579 Unspecified disorder of eye and adnexa: Secondary | ICD-10-CM
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
## 2015-09-09 NOTE — Discharge Instructions (Signed)
We recommend that you continue use of erythromycin ointment for lubrication and to prevent infection. We were unable to locate a foreign body in your eye today. We recommend that you follow up with your eye doctor on Monday.  Eye Foreign Body A foreign body refers to any object on the surface of the eye or in the eyeball that should not be there. A foreign body may be a small speck of dirt or dust, a hair or eyelash, a splinter, or any other object.  SIGNS AND SYMPTOMS Symptoms depend on what the foreign body is and where it is in the eye. The most common locations are:   On the inner surface of the upper or lower eyelids or on the covering of the white part of the eye (conjunctiva). Symptoms in this location are:  Pain and irritation, especially when blinking.  The feeling that something is in the eye.  On the surface of the clear covering on the front of the eye (cornea). Symptoms in this location include:  Pain and irritation.   Small "rust rings" around a metallic foreign body.  The feeling that something is in the eye.   Inside the eyeball. Foreign bodies inside the eye may cause:   Great pain.   Immediate loss of vision.   Distortion of the pupil. DIAGNOSIS  Foreign bodies are found during an exam by an eye specialist. Those on the eyelids, conjunctiva, or cornea are usually (but not always) easily found. When a foreign body is inside the eyeball, a cloudiness of the lens (cataract) may form almost right away. This makes it hard for an eye specialist to find the foreign body. Tests may be needed, including ultrasound testing, X-rays, and CT scans. TREATMENT   Foreign bodies on the eyelids, conjunctiva, or cornea are often removed easily and painlessly.  Rust in the cornea may require the use of a drill-like instrument to remove the rust.  If the foreign body has caused a scratch or a rubbing or scraping (abrasion) of the cornea, this may be treated with antibiotic  drops or ointment. A pressure patch may be put over your eye.  If the foreign body is inside your eyeball, surgery is needed right away. This is a medical emergency. Foreign bodies inside the eye threaten vision. A person may even lose his or her eye. HOME CARE INSTRUCTIONS   Take medicines only as directed by your health care provider. Use eye drops or ointment as directed.  If no eye patch was applied:  Keep your eye closed as much as possible.  Do not rub your eye.  Wear dark glasses as needed to protect your eyes from bright light.  Do not wear contact lenses until your eye feels normal again, or as instructed by your health care provider.  Wear a protective eye covering if there is a risk of eye injury. This is important when working with high-speed tools.  If your eye is patched:  Follow your health care provider's instructions for when to remove the patch.  Do notdrive or operate machinery if your eye is patched. Your ability to judge distances is impaired.  Keep all follow-up visits as directed by your health care provider. This is important. SEEK MEDICAL CARE IF:   You have increased pain in your eye.  Your vision gets worse.   You have problems with your eye patch.   You have fluid (discharge) coming from your injured eye.   You have redness and swelling around  your affected eye.  MAKE SURE YOU:   Understand these instructions.  Will watch your condition.  Will get help right away if you are not doing well or get worse.   This information is not intended to replace advice given to you by your health care provider. Make sure you discuss any questions you have with your health care provider.   Document Released: 05/13/2005 Document Revised: 06/03/2014 Document Reviewed: 10/08/2012 Elsevier Interactive Patient Education [employer]

## 2015-09-09 NOTE — ED Notes (Addendum)
Pt presents with c/o foreign object in his left eye, possibly dirt, dust, or debris. [facility] but they were not able to get anything out of his eye and he still feels like something is "sitting up in his eyelid".

## 2015-11-08 ENCOUNTER — Ambulatory Visit (HOSPITAL_COMMUNITY)
Admission: EM | Admit: 2015-11-08 | Discharge: 2015-11-08 | Disposition: A | Discharge status: Home / Self Care | Payer: Medicare Other | Attending: Emergency Medicine | Admitting: Emergency Medicine

## 2015-11-08 ENCOUNTER — Encounter (HOSPITAL_COMMUNITY): Payer: Self-pay | Admitting: Emergency Medicine

## 2015-11-08 DIAGNOSIS — L603 Nail dystrophy: Secondary | ICD-10-CM

## 2015-11-08 MED ORDER — CLOBETASOL PROPIONATE 0.05 % EX CREA: 1.0000 "application " | TOPICAL_CREAM | Freq: Two times a day (BID) | CUTANEOUS | Status: AC

## 2015-11-08 MED ORDER — CALCIPOTRIENE 0.005 % EX OINT: TOPICAL_OINTMENT | Freq: Two times a day (BID) | CUTANEOUS | Status: AC
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
## 2015-11-08 NOTE — Discharge Instructions (Signed)
The appearance of your nails today is consistent with brittle nails. Try to wash chemicals off your nails as soon as possible with gentle soap and warm water. If possible, wear waterproof gloves to avoid chemical contact to nails.  It is important to also eat a heavy balance diet rich in vitamins to help with nail growth.  Gelatin as well as vitamin D are good at helping promote healthy nail growth.

## 2015-11-08 NOTE — ED Notes (Signed)
PT reports he believes he has a bacterial infection in a few of his fingernails. 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 PT is currently on amoxicillin for a wisdom tooth extraction that was done on Monday.

## 2016-01-22 DIAGNOSIS — G5790 Unspecified mononeuropathy of unspecified lower limb: Secondary | ICD-10-CM | POA: Diagnosis not present

## 2016-02-08 DIAGNOSIS — R202 Paresthesia of skin: Secondary | ICD-10-CM | POA: Diagnosis not present

## 2016-02-08 DIAGNOSIS — M5417 Radiculopathy, lumbosacral region: Secondary | ICD-10-CM | POA: Diagnosis not present

## 2016-02-08 DIAGNOSIS — R411 Anterograde amnesia: Secondary | ICD-10-CM | POA: Diagnosis not present

## 2016-05-07 ENCOUNTER — Emergency Department (HOSPITAL_COMMUNITY)
Admission: EM | Admit: 2016-05-07 | Discharge: 2016-05-07 | Disposition: A | Discharge status: Home / Self Care | Payer: Medicare Other | Attending: Emergency Medicine | Admitting: Emergency Medicine

## 2016-05-07 ENCOUNTER — Emergency Department (HOSPITAL_COMMUNITY): Payer: Medicare Other

## 2016-05-07 ENCOUNTER — Encounter (HOSPITAL_COMMUNITY): Payer: Self-pay | Admitting: *Deleted

## 2016-05-07 DIAGNOSIS — S0990XA Unspecified injury of head, initial encounter: Secondary | ICD-10-CM

## 2016-05-07 DIAGNOSIS — Y9389 Activity, other specified: Secondary | ICD-10-CM | POA: Insufficient documentation

## 2016-05-07 DIAGNOSIS — F17200 Nicotine dependence, unspecified, uncomplicated: Secondary | ICD-10-CM | POA: Diagnosis not present

## 2016-05-07 DIAGNOSIS — Y990 Civilian activity done for income or pay: Secondary | ICD-10-CM | POA: Insufficient documentation

## 2016-05-07 DIAGNOSIS — R51 Headache: Secondary | ICD-10-CM | POA: Diagnosis not present

## 2016-05-07 DIAGNOSIS — W208XXA Other cause of strike by thrown, projected or falling object, initial encounter: Secondary | ICD-10-CM | POA: Diagnosis not present

## 2016-05-07 DIAGNOSIS — S060X0A Concussion without loss of consciousness, initial encounter: Secondary | ICD-10-CM | POA: Insufficient documentation

## 2016-05-07 DIAGNOSIS — Y929 Unspecified place or not applicable: Secondary | ICD-10-CM | POA: Insufficient documentation
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
## 2016-08-16 ENCOUNTER — Ambulatory Visit (HOSPITAL_COMMUNITY)
Admission: EM | Admit: 2016-08-16 | Discharge: 2016-08-16 | Disposition: A | Discharge status: Home / Self Care | Payer: Medicare Other | Attending: Family Medicine | Admitting: Family Medicine

## 2016-08-16 ENCOUNTER — Encounter (HOSPITAL_COMMUNITY): Payer: Self-pay | Admitting: Emergency Medicine

## 2016-08-16 DIAGNOSIS — R0789 Other chest pain: Secondary | ICD-10-CM | POA: Diagnosis not present

## 2016-08-16 MED ORDER — NAPROXEN 500 MG PO TABS: 500.0000 mg | ORAL_TABLET | Freq: Two times a day (BID) | ORAL | 0 refills | Status: AC

## 2016-08-16 MED ORDER — KETOROLAC TROMETHAMINE 60 MG/2ML IM SOLN: 60.0000 mg | Freq: Once | INTRAMUSCULAR | Status: DC

## 2016-08-16 MED ORDER — KETOROLAC TROMETHAMINE 60 MG/2ML IM SOLN
INTRAMUSCULAR | Status: AC
  Filled 2016-08-16: qty 2

## 2016-08-16 MED ORDER — KETOROLAC TROMETHAMINE 60 MG/2ML IM SOLN
60.0000 mg | Freq: Once | INTRAMUSCULAR | Status: AC
  Administered 2016-08-16: 60 mg via INTRAMUSCULAR

## 2016-08-16 NOTE — Discharge Instructions (Signed)
If pain returns then follow up at ED.
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
## 2016-08-16 NOTE — ED Triage Notes (Signed)
PT reports left sided chest pain that started yesterday while he was at work. PT reports pain is intermittent and is sharp when it occurs. PT reports left arm soreness. PT reports slight SOB as well. PT reports it could be anxiety. No history of any medical problems. PT reports a 41 yo friend recently passed away and it has made him more anxious about his health.

## 2017-04-14 DIAGNOSIS — H5030 Unspecified intermittent heterotropia: Secondary | ICD-10-CM | POA: Diagnosis not present

## 2017-04-14 DIAGNOSIS — H5213 Myopia, bilateral: Secondary | ICD-10-CM | POA: Diagnosis not present

## 2017-04-14 DIAGNOSIS — H532 Diplopia: Secondary | ICD-10-CM | POA: Diagnosis not present

## 2017-04-14 DIAGNOSIS — R51 Headache: Secondary | ICD-10-CM | POA: Diagnosis not present

## 2017-08-17 ENCOUNTER — Other Ambulatory Visit: Payer: Self-pay

## 2017-08-17 ENCOUNTER — Ambulatory Visit (HOSPITAL_COMMUNITY)
Admission: EM | Admit: 2017-08-17 | Discharge: 2017-08-17 | Disposition: A | Discharge status: Home / Self Care | Payer: Medicare Other | Attending: Physician Assistant | Admitting: Physician Assistant

## 2017-08-17 ENCOUNTER — Encounter (HOSPITAL_COMMUNITY): Payer: Self-pay

## 2017-08-17 DIAGNOSIS — J302 Other seasonal allergic rhinitis: Secondary | ICD-10-CM | POA: Diagnosis not present

## 2017-08-17 MED ORDER — LEVOCETIRIZINE DIHYDROCHLORIDE 5 MG PO TABS: 5.0000 mg | ORAL_TABLET | Freq: Every evening | ORAL | 0 refills | Status: AC

## 2017-08-17 MED ORDER — FLUTICASONE PROPIONATE 50 MCG/ACT NA SUSP: 2.0000 | Freq: Every day | NASAL | 0 refills | Status: AC

## 2017-08-17 NOTE — ED Provider Notes (Signed)
08/17/2017 1:12 PM   DOB: 04/11/1976 / MRN: [id number]  SUBJECTIVE:  Donald Ray is a 42 y.o. male presenting for nasal congestion, sneezing, ear itching.  Denies fever.  He is eating well. Taking OTC analgesics with some relief. Denies chest pain, SOB, new DOE, orthopnea, leg swelling.    He is allergic to sulfa antibiotics.   He  has no past medical history on file.    He  reports that he has been smoking cigars.  He has never used smokeless tobacco. He reports that he drinks alcohol. He reports that he does not use drugs. He  reports that he currently engages in sexual activity. He reports using the following method of birth control/protection: None. The patient  has a past surgical history that includes multiple trauma and Wisdom tooth extraction.  His family history includes Asthma in his father; Diabetes in his mother.  ROS per HPI  OBJECTIVE:  BP 128/84 (BP Location: Left Arm)   Pulse 74   Temp 98 F (36.7 C) (Oral)   Resp 18   SpO2 97%   Physical Exam  Constitutional: He appears well-developed. He is active and cooperative.  Non-toxic appearance.  HENT:  Right Ear: Hearing, tympanic membrane, external ear and ear canal normal.  Left Ear: Hearing, tympanic membrane, external ear and ear canal normal.  Nose: Mucosal edema and rhinorrhea present. Right sinus exhibits no maxillary sinus tenderness and no frontal sinus tenderness. Left sinus exhibits no maxillary sinus tenderness and no frontal sinus tenderness.  Mouth/Throat: Uvula is midline, oropharynx is clear and moist and mucous membranes are normal. No oropharyngeal exudate, posterior oropharyngeal edema or tonsillar abscesses.  Eyes: Pupils are equal, round, and reactive to light. Conjunctivae are normal.  Cardiovascular: Normal rate, regular rhythm, S1 normal, S2 normal, normal heart sounds, intact distal pulses and normal pulses. Exam reveals no gallop and no friction rub.  No murmur heard. Pulmonary/Chest: Effort  normal. No stridor. No tachypnea. No respiratory distress. He has no wheezes. He has no rales.  Abdominal: He exhibits no distension.  Musculoskeletal: He exhibits no edema.  Lymphadenopathy:       Head (right side): No submandibular and no tonsillar adenopathy present.       Head (left side): No submandibular and no tonsillar adenopathy present.    He has no cervical adenopathy.  Neurological: He is alert.  Skin: Skin is warm and dry. He is not diaphoretic. No pallor.  Vitals reviewed.   No results found for this or any previous visit (from the past 72 hour(s)).  No results found.  ASSESSMENT AND PLAN:  No orders of the defined types were placed in this encounter.    Seasonal allergies - No red flags on exam.  Is taking Claritin.  I am adding in Xyzal along with Flonase.      The patient is advised to call or return to clinic if he does not see an improvement in symptoms, or to seek the care of the closest emergency department if he worsens with the above plan.   Deliah BostonMichael Donicia Druck, MHS, PA-C 08/17/2017 1:12 PM    Ofilia Neaslark, Alajia Schmelzer L, PA-C 08/17/17 1312

## 2017-08-17 NOTE — ED Triage Notes (Signed)
[facility], pt has been taking OTC medications to treat symptoms. Pt complains of runny nose, sniffling, and sneezing.

## 2017-08-17 NOTE — Discharge Instructions (Addendum)
Please come back as needed for this.

## 2018-06-15 ENCOUNTER — Other Ambulatory Visit: Payer: Self-pay

## 2018-06-15 ENCOUNTER — Emergency Department (HOSPITAL_COMMUNITY): Payer: No Typology Code available for payment source

## 2018-06-15 ENCOUNTER — Emergency Department (HOSPITAL_COMMUNITY)
Admission: EM | Admit: 2018-06-15 | Discharge: 2018-06-15 | Disposition: A | Discharge status: Home / Self Care | Payer: No Typology Code available for payment source | Attending: Emergency Medicine | Admitting: Emergency Medicine

## 2018-06-15 ENCOUNTER — Encounter (HOSPITAL_COMMUNITY): Payer: Self-pay

## 2018-06-15 DIAGNOSIS — N200 Calculus of kidney: Secondary | ICD-10-CM | POA: Diagnosis not present

## 2018-06-15 DIAGNOSIS — N132 Hydronephrosis with renal and ureteral calculous obstruction: Secondary | ICD-10-CM | POA: Diagnosis not present

## 2018-06-15 DIAGNOSIS — F17290 Nicotine dependence, other tobacco product, uncomplicated: Secondary | ICD-10-CM | POA: Insufficient documentation

## 2018-06-15 DIAGNOSIS — Z79899 Other long term (current) drug therapy: Secondary | ICD-10-CM | POA: Insufficient documentation

## 2018-06-15 DIAGNOSIS — R1110 Vomiting, unspecified: Secondary | ICD-10-CM | POA: Diagnosis not present

## 2018-06-15 DIAGNOSIS — R1030 Lower abdominal pain, unspecified: Secondary | ICD-10-CM | POA: Diagnosis present

## 2018-06-15 LAB — URINALYSIS, ROUTINE W REFLEX MICROSCOPIC
Bacteria, UA: NONE SEEN
Bilirubin Urine: NEGATIVE
GLUCOSE, UA: NEGATIVE mg/dL
Ketones, ur: 5 mg/dL — AB
Leukocytes, UA: NEGATIVE
Nitrite: NEGATIVE
PH: 8 (ref 5.0–8.0)
Protein, ur: NEGATIVE mg/dL
SPECIFIC GRAVITY, URINE: 1.017 (ref 1.005–1.030)

## 2018-06-15 LAB — LIPASE, BLOOD: Lipase: 27 U/L (ref 11–51)

## 2018-06-15 LAB — COMPREHENSIVE METABOLIC PANEL
ALBUMIN: 5 g/dL (ref 3.5–5.0)
ALK PHOS: 94 U/L (ref 38–126)
ALT: 42 U/L (ref 0–44)
ANION GAP: 11 (ref 5–15)
AST: 34 U/L (ref 15–41)
BILIRUBIN TOTAL: 1.6 mg/dL — AB (ref 0.3–1.2)
BUN: 22 mg/dL — ABNORMAL HIGH (ref 6–20)
CALCIUM: 9.5 mg/dL (ref 8.9–10.3)
CO2: 24 mmol/L (ref 22–32)
Chloride: 105 mmol/L (ref 98–111)
Creatinine, Ser: 1.55 mg/dL — ABNORMAL HIGH (ref 0.61–1.24)
GFR calc Af Amer: >60 mL/min (ref >60)
GFR calc non Af Amer: 54 mL/min — ABNORMAL LOW (ref >60)
Glucose, Bld: 138 mg/dL — ABNORMAL HIGH (ref 70–99)
Potassium: 3.9 mmol/L (ref 3.5–5.1)
Sodium: 140 mmol/L (ref 135–145)
TOTAL PROTEIN: 7.7 g/dL (ref 6.5–8.1)

## 2018-06-15 LAB — CBC
HCT: 47.2 % (ref 39.0–52.0)
Hemoglobin: 16.1 g/dL (ref 13.0–17.0)
MCH: 31.3 pg (ref 26.0–34.0)
MCHC: 34.1 g/dL (ref 30.0–36.0)
MCV: 91.7 fL (ref 80.0–100.0)
PLATELETS: 246 10*3/uL (ref 150–400)
RBC: 5.15 MIL/uL (ref 4.22–5.81)
RDW: 12.5 % (ref 11.5–15.5)
WBC: 12 10*3/uL — ABNORMAL HIGH (ref 4.0–10.5)
nRBC: 0 % (ref 0.0–0.2)

## 2018-06-15 MED ORDER — SODIUM CHLORIDE 0.9 % IV BOLUS
1000.0000 mL | Freq: Once | INTRAVENOUS | Status: AC
Start: 2018-06-15 — End: 2018-06-15
  Administered 2018-06-15: 1000 mL via INTRAVENOUS

## 2018-06-15 MED ORDER — IOPAMIDOL (ISOVUE-300) INJECTION 61%
100.0000 mL | Freq: Once | INTRAVENOUS | Status: AC | PRN
  Administered 2018-06-15: 100 mL via INTRAVENOUS

## 2018-06-15 MED ORDER — OXYCODONE-ACETAMINOPHEN 5-325 MG PO TABS: 2.0000 | ORAL_TABLET | ORAL | 0 refills | Status: AC | PRN

## 2018-06-15 MED ORDER — SODIUM CHLORIDE (PF) 0.9 % IJ SOLN
INTRAMUSCULAR | Status: AC
  Filled 2018-06-15: qty 50

## 2018-06-15 MED ORDER — SODIUM CHLORIDE 0.9 % IV BOLUS
1000.0000 mL | Freq: Once | INTRAVENOUS | Status: AC
  Administered 2018-06-15: 1000 mL via INTRAVENOUS

## 2018-06-15 MED ORDER — SODIUM CHLORIDE 0.9 % IV SOLN: INTRAVENOUS | Status: DC

## 2018-06-15 MED ORDER — METOCLOPRAMIDE HCL 5 MG/ML IJ SOLN
5.0000 mg | Freq: Once | INTRAMUSCULAR | Status: AC
  Administered 2018-06-15: 5 mg via INTRAVENOUS
  Filled 2018-06-15: qty 2

## 2018-06-15 MED ORDER — IOPAMIDOL (ISOVUE-300) INJECTION 61%
INTRAVENOUS | Status: AC
  Filled 2018-06-15: qty 100

## 2018-06-15 MED ORDER — SODIUM CHLORIDE 0.9% FLUSH
3.0000 mL | Freq: Once | INTRAVENOUS | Status: AC
  Administered 2018-06-15: 3 mL via INTRAVENOUS

## 2018-06-15 MED ORDER — ONDANSETRON 8 MG PO TBDP: 8.0000 mg | ORAL_TABLET | Freq: Three times a day (TID) | ORAL | 0 refills | Status: DC | PRN

## 2018-06-15 MED ORDER — LORAZEPAM 2 MG/ML IJ SOLN
1.0000 mg | Freq: Once | INTRAMUSCULAR | Status: AC
  Administered 2018-06-15: 1 mg via INTRAVENOUS
  Filled 2018-06-15: qty 1

## 2018-06-15 MED ORDER — MORPHINE SULFATE (PF) 4 MG/ML IV SOLN
6.0000 mg | Freq: Once | INTRAVENOUS | Status: AC
  Administered 2018-06-15: 6 mg via INTRAVENOUS
  Filled 2018-06-15: qty 2

## 2018-06-15 MED ORDER — TAMSULOSIN HCL 0.4 MG PO CAPS: 0.4000 mg | ORAL_CAPSULE | Freq: Every day | ORAL | 0 refills | Status: AC

## 2018-06-15 NOTE — ED Provider Notes (Signed)
UA without signs of infection.  UA without signs of infection.  Plan for discharge   Linwood Dibbles, MD 06/15/18 1705

## 2018-06-15 NOTE — ED Triage Notes (Signed)
Patient c/o mid abdominal pain and N/V since 0600 today.

## 2018-06-15 NOTE — ED Notes (Signed)
Patient transported to CT 
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
## 2018-06-15 NOTE — Discharge Instructions (Signed)
Call the urologist today to schedule an appointment to be seen this week.  You have a kidney stone that is 6 mm x 8 mm and needs close follow-up

## 2018-06-18 ENCOUNTER — Other Ambulatory Visit: Payer: Self-pay | Admitting: Urology

## 2018-06-19 ENCOUNTER — Other Ambulatory Visit: Payer: Self-pay | Admitting: Urology

## 2018-06-22 ENCOUNTER — Other Ambulatory Visit: Payer: Self-pay

## 2018-06-22 ENCOUNTER — Ambulatory Visit (HOSPITAL_COMMUNITY): Payer: No Typology Code available for payment source

## 2018-06-22 ENCOUNTER — Ambulatory Visit (HOSPITAL_COMMUNITY)
Admission: RE | Admit: 2018-06-22 | Discharge: 2018-06-22 | Disposition: A | Discharge status: Home / Self Care | Payer: No Typology Code available for payment source | Attending: Urology | Admitting: Urology

## 2018-06-22 ENCOUNTER — Encounter (HOSPITAL_COMMUNITY)
Admission: RE | Disposition: A | Discharge status: Home / Self Care | Payer: Self-pay | Source: Home / Self Care | Attending: Urology

## 2018-06-22 ENCOUNTER — Encounter (HOSPITAL_COMMUNITY): Payer: Self-pay | Admitting: *Deleted

## 2018-06-22 DIAGNOSIS — F17290 Nicotine dependence, other tobacco product, uncomplicated: Secondary | ICD-10-CM | POA: Diagnosis not present

## 2018-06-22 DIAGNOSIS — Z882 Allergy status to sulfonamides status: Secondary | ICD-10-CM | POA: Diagnosis not present

## 2018-06-22 DIAGNOSIS — Z833 Family history of diabetes mellitus: Secondary | ICD-10-CM | POA: Diagnosis not present

## 2018-06-22 DIAGNOSIS — N202 Calculus of kidney with calculus of ureter: Secondary | ICD-10-CM | POA: Diagnosis present

## 2018-06-22 DIAGNOSIS — Z825 Family history of asthma and other chronic lower respiratory diseases: Secondary | ICD-10-CM | POA: Insufficient documentation

## 2018-06-22 DIAGNOSIS — N201 Calculus of ureter: Secondary | ICD-10-CM | POA: Diagnosis not present

## 2018-06-22 HISTORY — PX: EXTRACORPOREAL SHOCK WAVE LITHOTRIPSY: SHX1557

## 2018-06-22 SURGERY — LITHOTRIPSY, ESWL
Anesthesia: LOCAL | Laterality: Right

## 2018-06-22 MED ORDER — SODIUM CHLORIDE 0.9 % IV SOLN
INTRAVENOUS | Status: DC
  Administered 2018-06-22: 10:00:00 via INTRAVENOUS
  Administered 2018-06-22: 1000 mL via INTRAVENOUS

## 2018-06-22 MED ORDER — CIPROFLOXACIN HCL 500 MG PO TABS
500.0000 mg | ORAL_TABLET | ORAL | Status: AC
  Administered 2018-06-22: 500 mg via ORAL
  Filled 2018-06-22: qty 1

## 2018-06-22 MED ORDER — DIPHENHYDRAMINE HCL 25 MG PO CAPS
25.0000 mg | ORAL_CAPSULE | ORAL | Status: AC
  Administered 2018-06-22: 25 mg via ORAL
  Filled 2018-06-22: qty 1

## 2018-06-22 MED ORDER — DIAZEPAM 5 MG PO TABS
10.0000 mg | ORAL_TABLET | ORAL | Status: AC
  Administered 2018-06-22: 10 mg via ORAL
  Filled 2018-06-22: qty 2

## 2018-06-22 SURGICAL SUPPLY — 3 items
COVER SURGICAL LIGHT HANDLE (MISCELLANEOUS) ×2 IMPLANT
COVER WAND RF STERILE (DRAPES) IMPLANT
TOWEL OR 17X26 10 PK STRL BLUE (TOWEL DISPOSABLE) ×2 IMPLANT

## 2018-06-22 NOTE — Op Note (Signed)
See [facility] OP note scanned into chart. 

## 2018-06-22 NOTE — Discharge Instructions (Signed)
See [facility] discharge instructions in chart.  
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
## 2018-06-23 ENCOUNTER — Encounter (HOSPITAL_COMMUNITY): Payer: Self-pay | Admitting: Urology

## 2020-11-21 IMAGING — CT CT ABD-PELV W/ CM
2 of 5 series · 16 of 46 positions shown, 18 images · IV contrast (ISOVUE)
Comparison: Plain film 08/25/2007. No prior CT.

CLINICAL DATA: Mid abdominal pain with nausea and vomiting since
yesterday.

EXAM:
CT ABDOMEN AND PELVIS WITH CONTRAST
TECHNIQUE: Multidetector CT imaging of the abdomen and pelvis was performed
using the standard protocol following bolus administration of
intravenous contrast.
CONTRAST:  100mL MOJPB0-8HH IOPAMIDOL (MOJPB0-8HH) INJECTION 61%

[Series 2: axial st · axial · 0.81mm/px · z∈[-721,-266]mm · 13 of 105 slices shown, 15 images]
[im 7/105  soft-tissue]
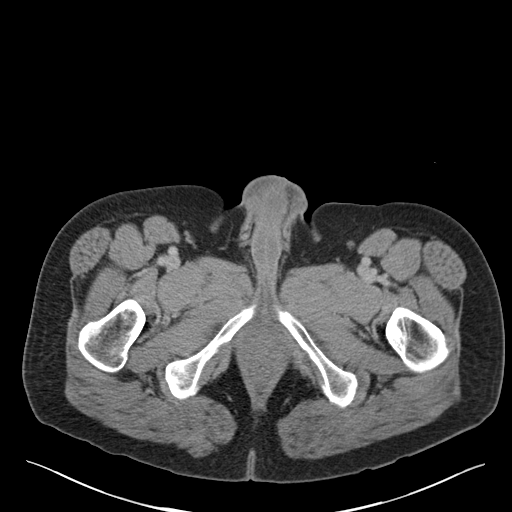
[im 7/105  bone]
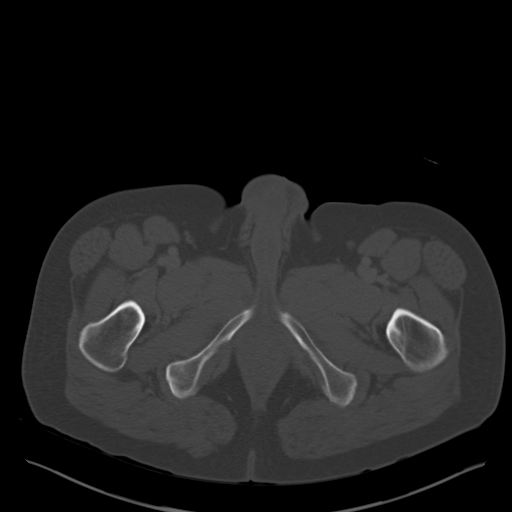
[im 14/105  soft-tissue]
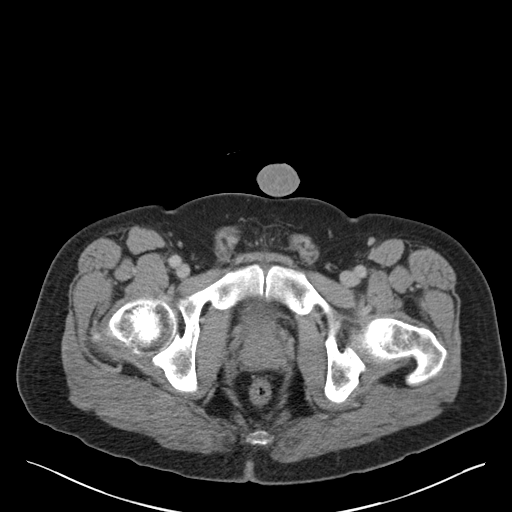
[im 21/105  soft-tissue]
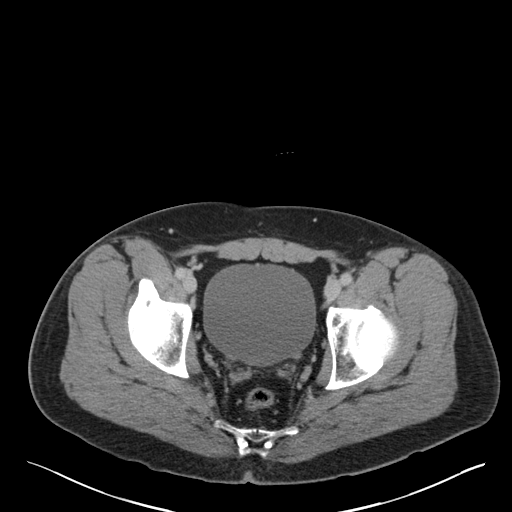
[im 28/105  soft-tissue]
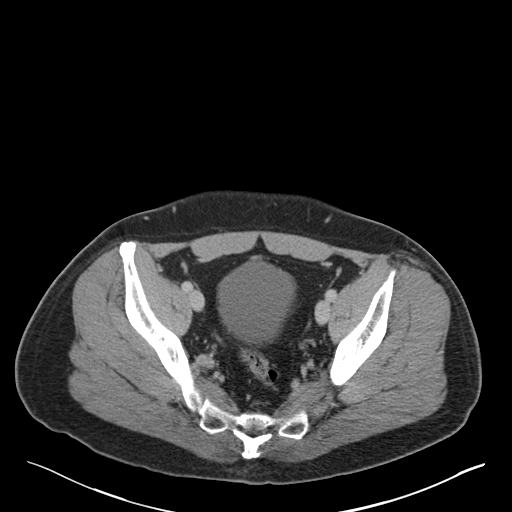
[im 35/105  soft-tissue]
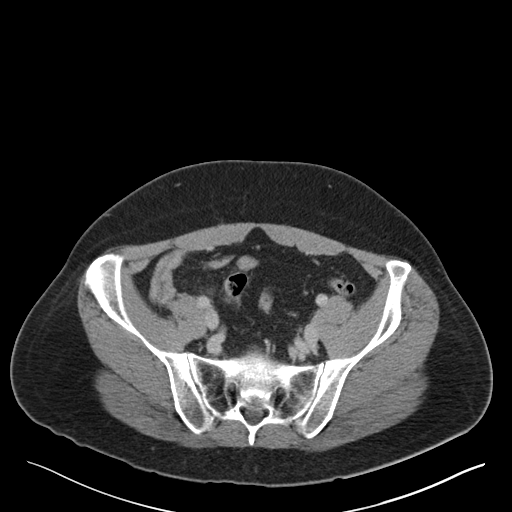
[im 42/105  soft-tissue]
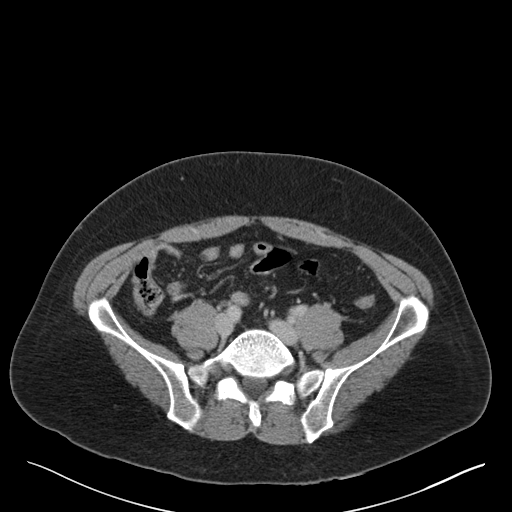
[im 56/105  soft-tissue]
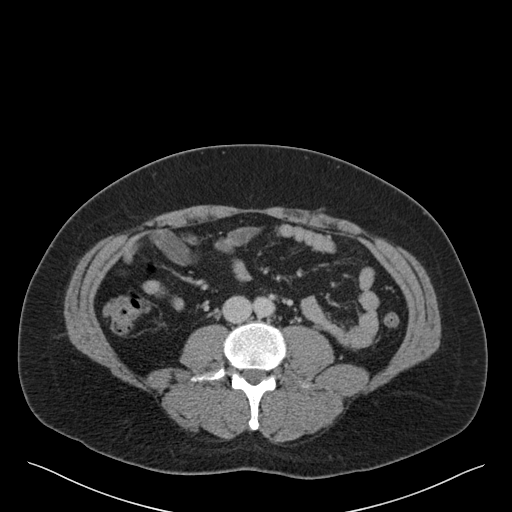
[im 63/105  soft-tissue]
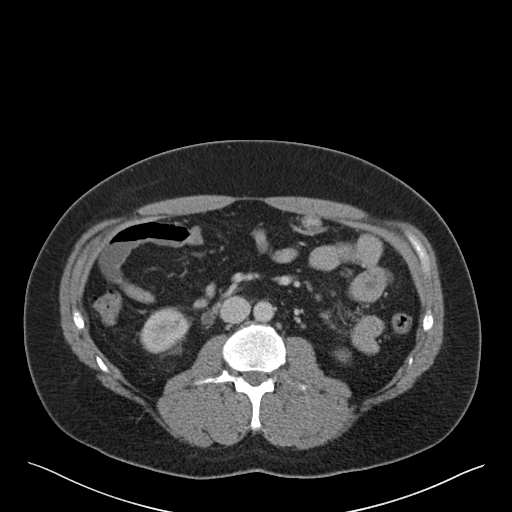
[im 70/105  soft-tissue]
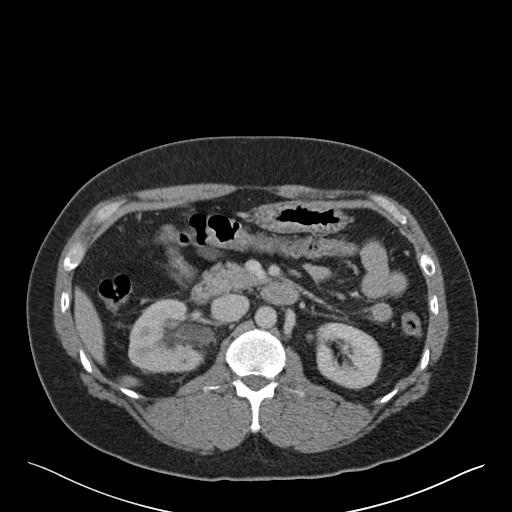
[im 70/105  bone]
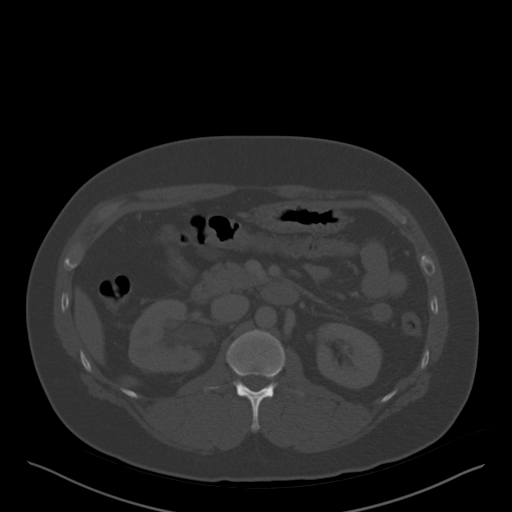
[im 77/105  soft-tissue]
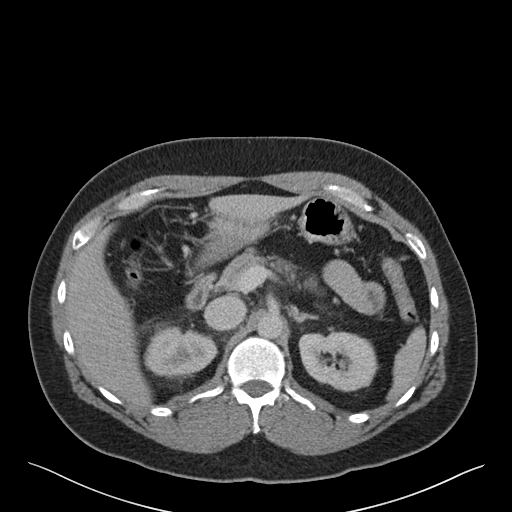
[im 84/105  soft-tissue]
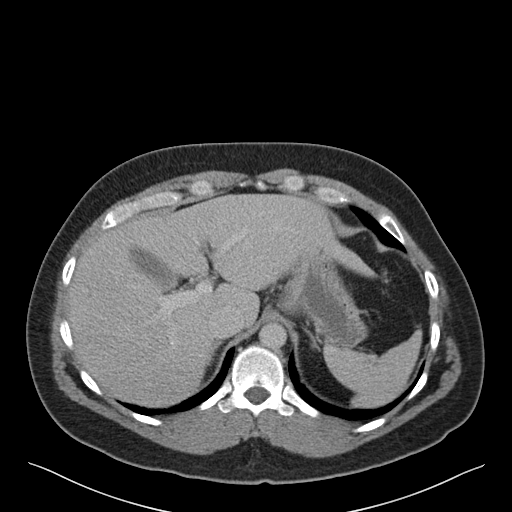
[im 91/105  soft-tissue]
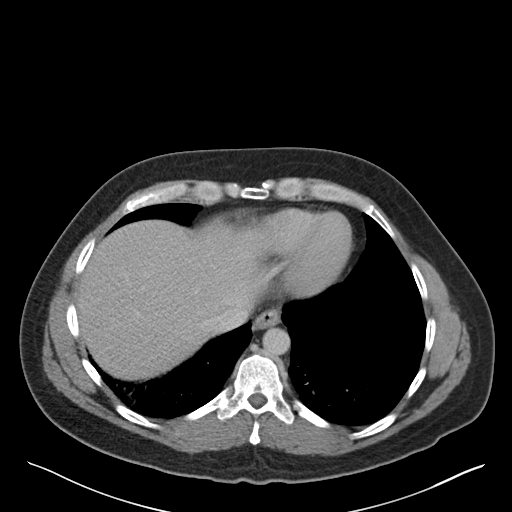
[im 98/105  soft-tissue]
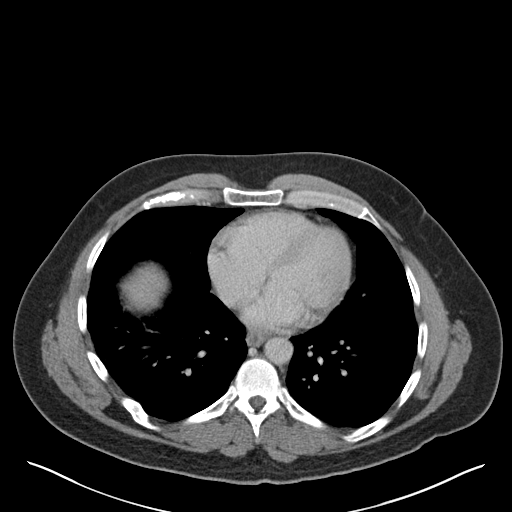

[Series 4: coronal st · coronal · 0.96mm/px · 3 of 74 slices shown]
[im 25/74  soft-tissue]
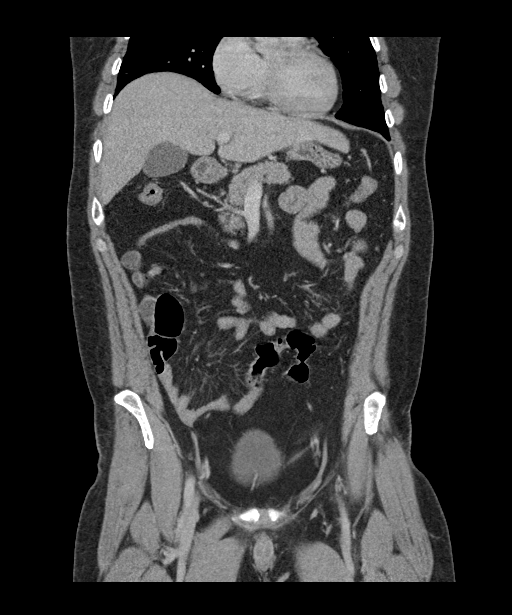
[im 33/74  soft-tissue]
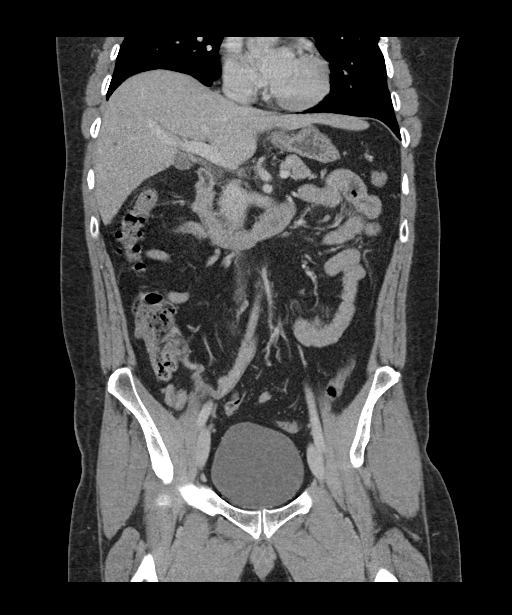
[im 41/74  soft-tissue]
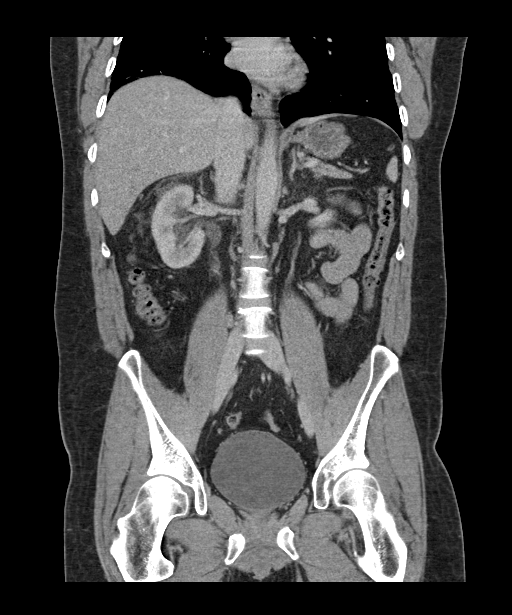

[16 of 46 positions shown; findings below may reference images not displayed]

FINDINGS: Lower chest: Clear lung bases. Normal heart size without pericardial
or pleural effusion.

Hepatobiliary: A too small to characterize high right hepatic lobe
lesion. Normal gallbladder, without biliary ductal dilatation.

Pancreas: Normal, without mass or ductal dilatation.

Spleen: Normal in size, without focal abnormality.

Adrenals/Urinary Tract: Normal adrenal glands. A too small to
characterize lower pole right renal lesion. Mild right-sided
hydroureteronephrosis secondary to a proximal right ureteric stone
which measures on the order of 6 x 8 mm, including image 46/2 and
image 40/4.

Normal urinary bladder.

Stomach/Bowel: Normal stomach, without wall thickening. Normal colon
and terminal ileum. Normal small bowel.

Vascular/Lymphatic: Normal caliber of the aorta and branch vessels.
No abdominopelvic adenopathy.

Reproductive: Normal prostate.

Other: No significant free fluid.

Musculoskeletal: No acute osseous abnormality. Remote lower right
rib fractures.
IMPRESSION: Mild right-sided hydroureteronephrosis secondary to a proximal right
ureteric stone.

## 2021-07-09 ENCOUNTER — Ambulatory Visit (HOSPITAL_COMMUNITY)
Admission: EM | Admit: 2021-07-09 | Discharge: 2021-07-09 | Disposition: A | Discharge status: Home / Self Care | Payer: PRIVATE HEALTH INSURANCE | Attending: Emergency Medicine | Admitting: Emergency Medicine

## 2021-07-09 ENCOUNTER — Encounter (HOSPITAL_COMMUNITY): Payer: Self-pay

## 2021-07-09 DIAGNOSIS — J069 Acute upper respiratory infection, unspecified: Secondary | ICD-10-CM | POA: Insufficient documentation

## 2021-07-09 DIAGNOSIS — Z20822 Contact with and (suspected) exposure to covid-19: Secondary | ICD-10-CM | POA: Diagnosis not present

## 2021-07-09 DIAGNOSIS — R059 Cough, unspecified: Secondary | ICD-10-CM | POA: Diagnosis not present

## 2021-07-09 LAB — POCT RAPID STREP A, ED / UC: Streptococcus, Group A Screen (Direct): NEGATIVE

## 2021-07-09 NOTE — ED Triage Notes (Signed)
Pt presents for cough, congestion x 3 days. He is taking OTC with no relief.
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
## 2021-07-09 NOTE — Discharge Instructions (Signed)
Your symptoms today are most likely being caused by a virus and should steadily improve in time it can take up to 7 to 10 days before you truly start to see a turnaround however things will get better  Strep test negative  COVID test pending, you will be notified if positive    You can take Tylenol and/or Ibuprofen as needed for fever reduction and pain relief.   For cough: honey 1/2 to 1 teaspoon (you can dilute the honey in water or another fluid).  You can also use guaifenesin and dextromethorphan for cough. You can use a humidifier for chest congestion and cough.  If you don't have a humidifier, you can sit in the bathroom with the hot shower running.      For sore throat: try warm salt water gargles, cepacol lozenges, throat spray, warm tea or water with lemon/honey, popsicles or ice, or OTC cold relief medicine for throat discomfort.   For congestion: take a daily anti-histamine like Zyrtec, Claritin, and a oral decongestant, such as pseudoephedrine.  You can also use Flonase 1-2 sprays in each nostril daily.   It is important to stay hydrated: drink plenty of fluids (water, gatorade/powerade/pedialyte, juices, or teas) to keep your throat moisturized and help further relieve irritation/discomfort.

## 2021-07-10 LAB — SARS CORONAVIRUS 2 (TAT 6-24 HRS): SARS Coronavirus 2: NEGATIVE

## 2021-07-12 LAB — CULTURE, GROUP A STREP (THRC)

## 2022-03-08 ENCOUNTER — Encounter (HOSPITAL_COMMUNITY): Payer: Self-pay | Admitting: *Deleted

## 2022-03-08 ENCOUNTER — Ambulatory Visit (HOSPITAL_COMMUNITY)
Admission: EM | Admit: 2022-03-08 | Discharge: 2022-03-08 | Disposition: A | Discharge status: Home / Self Care | Payer: Medicaid Other | Attending: Physician Assistant | Admitting: Physician Assistant

## 2022-03-08 DIAGNOSIS — S83422A Sprain of lateral collateral ligament of left knee, initial encounter: Secondary | ICD-10-CM | POA: Diagnosis not present

## 2022-03-08 DIAGNOSIS — M25562 Pain in left knee: Secondary | ICD-10-CM

## 2022-03-08 MED ORDER — IBUPROFEN 800 MG PO TABS: 800.0000 mg | ORAL_TABLET | Freq: Three times a day (TID) | ORAL | 0 refills | Status: AC

## 2022-03-08 NOTE — Discharge Instructions (Signed)
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 Advised to wear the brace when you are up and working and standing for long periods of time as this will help give added support and take the pressure off the collateral ligament. Advised take ibuprofen 800 mg with food to help reduce pain and swelling. Advised ice therapy, 10 minutes on 20 minutes off, 3-4 times throughout the evening when at home to help reduce pain and swelling. Advised to consider orthopedic consult if symptoms fail to improve over the next 10 days with conservative treatment.

## 2022-03-08 NOTE — ED Triage Notes (Signed)
Pt states left knee pain x 1 week he can feel it popping and its painful. He doesn't recall any injury to it. He is taking tylenol and glucosamine without relief.
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
## 2024-07-02 ENCOUNTER — Emergency Department (HOSPITAL_COMMUNITY)

## 2024-07-02 ENCOUNTER — Other Ambulatory Visit: Payer: Self-pay

## 2024-07-02 ENCOUNTER — Encounter (HOSPITAL_COMMUNITY): Payer: Self-pay | Admitting: Emergency Medicine

## 2024-07-02 ENCOUNTER — Emergency Department (HOSPITAL_COMMUNITY)
Admission: EM | Admit: 2024-07-02 | Discharge: 2024-07-02 | Disposition: A | Discharge status: Home / Self Care | Source: Home / Self Care | Attending: Emergency Medicine | Admitting: Emergency Medicine

## 2024-07-02 DIAGNOSIS — R1084 Generalized abdominal pain: Secondary | ICD-10-CM

## 2024-07-02 DIAGNOSIS — R112 Nausea with vomiting, unspecified: Secondary | ICD-10-CM

## 2024-07-02 LAB — COMPREHENSIVE METABOLIC PANEL WITH GFR
ALT: 55 U/L — ABNORMAL HIGH (ref 0–44)
AST: 38 U/L (ref 15–41)
Albumin: 5 g/dL (ref 3.5–5.0)
Alkaline Phosphatase: 156 U/L — ABNORMAL HIGH (ref 38–126)
Anion gap: 19 — ABNORMAL HIGH (ref 5–15)
BUN: 23 mg/dL — ABNORMAL HIGH (ref 6–20)
CO2: 20 mmol/L — ABNORMAL LOW (ref 22–32)
Calcium: 10.2 mg/dL (ref 8.9–10.3)
Chloride: 101 mmol/L (ref 98–111)
Creatinine, Ser: 1.15 mg/dL (ref 0.61–1.24)
GFR, Estimated: >60 mL/min
Glucose, Bld: 173 mg/dL — ABNORMAL HIGH (ref 70–99)
Potassium: 4.3 mmol/L (ref 3.5–5.1)
Sodium: 141 mmol/L (ref 135–145)
Total Bilirubin: 1.4 mg/dL — ABNORMAL HIGH (ref 0.0–1.2)
Total Protein: 8.6 g/dL — ABNORMAL HIGH (ref 6.5–8.1)

## 2024-07-02 LAB — CBC
HCT: 52.7 % — ABNORMAL HIGH (ref 39.0–52.0)
Hemoglobin: 18.7 g/dL — ABNORMAL HIGH (ref 13.0–17.0)
MCH: 31.8 pg (ref 26.0–34.0)
MCHC: 35.5 g/dL (ref 30.0–36.0)
MCV: 89.6 fL (ref 80.0–100.0)
Platelets: 303 10*3/uL (ref 150–400)
RBC: 5.88 MIL/uL — ABNORMAL HIGH (ref 4.22–5.81)
RDW: 12.5 % (ref 11.5–15.5)
WBC: 14.7 10*3/uL — ABNORMAL HIGH (ref 4.0–10.5)
nRBC: 0 % (ref 0.0–0.2)

## 2024-07-02 LAB — URINALYSIS, ROUTINE W REFLEX MICROSCOPIC
Bilirubin Urine: NEGATIVE
Glucose, UA: NEGATIVE mg/dL
Ketones, ur: NEGATIVE mg/dL
Leukocytes,Ua: NEGATIVE
Nitrite: NEGATIVE
Protein, ur: 30 mg/dL — AB
Specific Gravity, Urine: <1.005 — ABNORMAL LOW (ref 1.005–1.030)
pH: 5.5 (ref 5.0–8.0)

## 2024-07-02 LAB — URINALYSIS, MICROSCOPIC (REFLEX): Bacteria, UA: NONE SEEN

## 2024-07-02 LAB — LIPASE, BLOOD: Lipase: <10 U/L — ABNORMAL LOW (ref 11–51)

## 2024-07-02 MED ORDER — IOHEXOL 350 MG/ML SOLN
75.0000 mL | Freq: Once | INTRAVENOUS | Status: AC | PRN
  Administered 2024-07-02: 75 mL via INTRAVENOUS

## 2024-07-02 MED ORDER — MORPHINE SULFATE (PF) 4 MG/ML IV SOLN
4.0000 mg | Freq: Once | INTRAVENOUS | Status: AC
  Administered 2024-07-02: 4 mg via INTRAVENOUS
  Filled 2024-07-02: qty 1

## 2024-07-02 MED ORDER — ONDANSETRON 4 MG PO TBDP: 4.0000 mg | ORAL_TABLET | Freq: Three times a day (TID) | ORAL | 0 refills | Status: AC | PRN

## 2024-07-02 MED ORDER — ONDANSETRON HCL 4 MG/2ML IJ SOLN
4.0000 mg | Freq: Once | INTRAMUSCULAR | Status: AC | PRN
  Administered 2024-07-02: 4 mg via INTRAVENOUS
  Filled 2024-07-02: qty 2

## 2024-07-02 NOTE — ED Notes (Signed)
 RN made aware of high bp. Patient would not keep his arm still.

## 2024-07-02 NOTE — Discharge Instructions (Signed)
 Your CT scan and lab work was reassuring this evening.  I have prescribed Zofran  to help with your nausea.  Follow-up with your primary care provider for further evaluation.  Return to the emergency department if you develop any life-threatening symptoms.

## 2024-07-02 NOTE — ED Notes (Signed)
 Patient in restroom.  Appears to be actively vomiting

## 2024-07-02 NOTE — ED Provider Notes (Signed)
 " [place] EMERGENCY DEPARTMENT AT [facility] Provider Note   CSN: [id number] Arrival date & time: 07/02/24  0125     Patient presents with: Abdominal Pain   Donald Ray is a 49 y.o. male.  Patient with past medical history significant for nephrolithiasis presents to the Emergency Department via EMS complaining of generalized abdominal pain.  He states the abdominal pain began at approximately 2 PM with associated nausea and vomiting.  Last night the patient reports eating a steak and cheese followed by muscle milk at lunch today.  Shortly after drinking the muscle milk he began to have severe abdominal pain with nausea and vomiting.  He states that after vomiting he felt that his abdomen was cramping severely.  He denies chest pain, shortness of breath, fever, urinary symptoms.    Abdominal Pain      Prior to Admission medications  Medication Sig Start Date End Date Taking? Authorizing Provider  ondansetron  (ZOFRAN -ODT) 4 MG disintegrating tablet Take 1 tablet (4 mg total) by mouth every 8 (eight) hours as needed for nausea or vomiting. 07/02/24  Yes Logan Ubaldo NOVAK, PA-C  albuterol  (PROVENTIL  HFA;VENTOLIN  HFA) 108 (90 BASE) MCG/ACT inhaler Inhale 2 puffs into the lungs every 4 (four) hours as needed for wheezing. Patient not taking: Reported on 06/15/2018 04/29/11 04/28/12  Devora Perkins, PA-C  calcipotriene  (DOVONOX) 0.005 % ointment Apply topically 2 (two) times daily. To nails Patient not taking: Reported on 06/15/2018 11/08/15   Anitra Rocky KIDD, PA-C  clobetasol  cream (TEMOVATE ) 0.05 % Apply 1 application topically 2 (two) times daily. Patient not taking: Reported on 06/15/2018 11/08/15   Anitra Rocky KIDD, PA-C  fluticasone  (FLONASE ) 50 MCG/ACT nasal spray Place 2 sprays into both nostrils daily. Patient not taking: Reported on 06/15/2018 08/17/17   Gretta Ozell CROME, PA-C  glucosamine-chondroitin 500-400 MG tablet Take 1 tablet by mouth daily as needed. Knee discomfort.     [provider]  ibuprofen  (ADVIL ) 800 MG tablet Take 1 tablet (800 mg total) by mouth 3 (three) times daily. 03/08/22   Lynwood Lenis, PA-C  levocetirizine (XYZAL ) 5 MG tablet Take 1 tablet (5 mg total) by mouth every evening. Patient not taking: Reported on 06/15/2018 08/17/17   Gretta Ozell CROME, PA-C  naproxen  (NAPROSYN ) 375 MG tablet Take 1 tablet (375 mg total) by mouth 2 (two) times daily. Patient not taking: Reported on 06/15/2018 09/28/14   Corey, Evan S, MD  naproxen  (NAPROSYN ) 500 MG tablet Take 1 tablet (500 mg total) by mouth 2 (two) times daily with a meal. Patient not taking: Reported on 06/15/2018 08/16/16   Pennie Elsie PARAS, FNP  Omega-3 Fatty Acids (FISH OIL PO) Take 2 capsules by mouth daily.      [provider]  OVER THE COUNTER MEDICATION Take 1 tablet by mouth daily. Mega Man    [provider]  oxyCODONE -acetaminophen  (PERCOCET/ROXICET) 5-325 MG tablet Take 2 tablets by mouth every 4 (four) hours as needed for severe pain. 06/15/18   Dasie Faden, MD  tamsulosin  (FLOMAX ) 0.4 MG CAPS capsule Take 1 capsule (0.4 mg total) by mouth daily. 06/15/18   Dasie Faden, MD    Allergies: Sulfa antibiotics    Review of Systems  Gastrointestinal:  Positive for abdominal pain.    Updated Vital Signs BP (!) 156/99 (BP Location: Right Arm)   Pulse 81   Temp 98.8 F (37.1 C) (Oral)   Resp (!) 21   Ht 5' 11 (1.803 m)   Wt 104.3 kg  SpO2 100%   BMI 32.08 kg/m   Physical Exam Vitals and nursing note reviewed.  Constitutional:      General: He is not in acute distress.    Appearance: He is well-developed.  HENT:     Head: Normocephalic and atraumatic.  Eyes:     Conjunctiva/sclera: Conjunctivae normal.  Cardiovascular:     Rate and Rhythm: Normal rate and regular rhythm.     Heart sounds: No murmur heard. Pulmonary:     Effort: Pulmonary effort is normal. No respiratory distress.     Breath sounds: Normal breath sounds.  Abdominal:      Palpations: Abdomen is soft.     Tenderness: There is generalized abdominal tenderness.  Musculoskeletal:        General: No swelling.     Cervical back: Neck supple.  Skin:    General: Skin is warm and dry.     Capillary Refill: Capillary refill takes less than 2 seconds.  Neurological:     Mental Status: He is alert.  Psychiatric:        Mood and Affect: Mood normal.     (all labs ordered are listed, but only abnormal results are displayed) Labs Reviewed  LIPASE, BLOOD - Abnormal; Notable for the following components:      Result Value   Lipase <10 (*)    All other components within normal limits  COMPREHENSIVE METABOLIC PANEL WITH GFR - Abnormal; Notable for the following components:   CO2 20 (*)    Glucose, Bld 173 (*)    BUN 23 (*)    Total Protein 8.6 (*)    ALT 55 (*)    Alkaline Phosphatase 156 (*)    Total Bilirubin 1.4 (*)    Anion gap 19 (*)    All other components within normal limits  CBC - Abnormal; Notable for the following components:   WBC 14.7 (*)    RBC 5.88 (*)    Hemoglobin 18.7 (*)    HCT 52.7 (*)    All other components within normal limits  URINALYSIS, ROUTINE W REFLEX MICROSCOPIC    EKG: None  Radiology: CT ABDOMEN PELVIS W CONTRAST Result Date: 07/02/2024 EXAM: CT ABDOMEN AND PELVIS WITH CONTRAST 07/02/2024 03:23:42 AM TECHNIQUE: CT of the abdomen and pelvis was performed with the administration of 75 mL of iohexol  (OMNIPAQUE ) 350 MG/ML injection. Multiplanar reformatted images are provided for review. Automated exposure control, iterative reconstruction, and/or weight-based adjustment of the mA/kV was utilized to reduce the radiation dose to as low as reasonably achievable. COMPARISON: CT abdomen and pelvis 06/15/2018. CLINICAL HISTORY: Abdominal pain, acute, nonlocalized. Acute, nonlocalized abdominal pain. FINDINGS: LOWER CHEST: No acute abnormality. LIVER: There is an 8 mm hypodensity in the right lobe of the liver new from prior study,  possibly a cyst or hemangioma. GALLBLADDER AND BILE DUCTS: Gallbladder is unremarkable. No biliary ductal dilatation. SPLEEN: No acute abnormality. PANCREAS: No acute abnormality. ADRENAL GLANDS: No acute abnormality. KIDNEYS, URETERS AND BLADDER: There are rounded hypodensities in both kidneys which are too small to characterize, likely cysts. No stones in the kidneys or ureters. No hydronephrosis. No perinephric or periureteral stranding. Urinary bladder is unremarkable. GI AND BOWEL: The appendix is within normal limits. Stomach demonstrates no acute abnormality. There is no bowel obstruction. PERITONEUM AND RETROPERITONEUM: No ascites. No free air. VASCULATURE: Aorta is normal in caliber. LYMPH NODES: No lymphadenopathy. REPRODUCTIVE ORGANS: No acute abnormality. BONES AND SOFT TISSUES: No acute osseous abnormality. No focal soft tissue abnormality. IMPRESSION:  1. No acute findings in the abdomen or pelvis. 2. New 8 mm right hepatic hypodensity, possibly a cyst or hemangioma. 3. Bilateral renal hypodensities too small to characterize, likely benign cysts with no follow-up imaging recommended. Electronically signed by: Greig Pique MD 07/02/2024 03:37 AM EST RP Workstation: [id number]     Procedures   Medications Ordered in the ED  ondansetron  (ZOFRAN ) injection 4 mg (4 mg Intravenous Given 07/02/24 0200)  morphine  (PF) 4 MG/ML injection 4 mg (4 mg Intravenous Given 07/02/24 0222)  iohexol  (OMNIPAQUE ) 350 MG/ML injection 75 mL (75 mLs Intravenous Contrast Given 07/02/24 0324)                                    Medical Decision Making Amount and/or Complexity of Data Reviewed Labs: ordered. Radiology: ordered.  Risk Prescription drug management.   This patient presents to the ED for concern of abdominal pain, this involves an extensive number of treatment options, and is a complaint that carries with it a high risk of complications and morbidity.  The differential diagnosis includes gastritis,  cholecystitis, appendicitis, colitis, others   Co morbidities / Chronic conditions that complicate the patient evaluation  History of nephrolithiasis   Additional history obtained:  Additional history obtained from EMR   Lab Tests:  I Ordered, and personally interpreted labs.  The pertinent results include: Patient with a leukocytosis with a white count of 14,700 of unclear significance   Imaging Studies ordered:  I ordered imaging studies including CT abdomen pelvis with contrast I independently visualized and interpreted imaging which showed  1. No acute findings in the abdomen or pelvis.  2. New 8 mm right hepatic hypodensity, possibly a cyst or hemangioma.  3. Bilateral renal hypodensities too small to characterize, likely benign cysts  with no follow-up imaging recommended.   I agree with the radiologist interpretation   Problem List / ED Course / Critical interventions / Medication management   I ordered medication including morphine , Zofran  Reevaluation of the patient after these medicines showed that the patient improved I have reviewed the patients home medicines and have made adjustments as needed   Social Determinants of Health:  Patient has Medicaid for his primary health insurance type, is an occasional tobacco smoker   Test / Admission - Considered:  Patient with no significant acute findings on workup other than mild leukocytosis of unclear significance.  UA pending at time of discharge.  Patient wishing to go home at this time.  He states his abdominal pain subsided and he is tolerating oral intake.  I will discharge with course of Zofran .  No surgical/life-threatening condition identified that would require further emergent workup or admission at this time.  Patient stable for discharge home.      Final diagnoses:  Generalized abdominal pain  Nausea and vomiting, unspecified vomiting type    ED Discharge Orders          Ordered    ondansetron   (ZOFRAN -ODT) 4 MG disintegrating tablet  Every 8 hours PRN        07/02/24 0550               Logan Ubaldo NOVAK, PA-C 07/02/24 9390    Lorette Mayo, MD 07/02/24 9291  "

## 2024-07-02 NOTE — ED Triage Notes (Signed)
 Patient BIB EMS for evaluation of abdominal pain.  Left work early due to pain.  Has had nausea/vomiting.
